# Patient Record
Sex: Male | Born: 1960
Health system: Southern US, Community
[De-identification: ages and names within clinical notes are randomized; demographics above are authoritative.]

## PROBLEM LIST (undated history)

## (undated) DIAGNOSIS — T887XXA Unspecified adverse effect of drug or medicament, initial encounter: Secondary | ICD-10-CM

## (undated) DIAGNOSIS — L723 Sebaceous cyst: Secondary | ICD-10-CM

## (undated) DIAGNOSIS — F200 Paranoid schizophrenia: Secondary | ICD-10-CM

## (undated) DIAGNOSIS — J301 Allergic rhinitis due to pollen: Secondary | ICD-10-CM

## (undated) DIAGNOSIS — T7840XA Allergy, unspecified, initial encounter: Secondary | ICD-10-CM

## (undated) HISTORY — DX: Sebaceous cyst: L72.3

## (undated) HISTORY — DX: Paranoid schizophrenia: F20.0

## (undated) HISTORY — PX: ANKLE SURGERY: SHX546

## (undated) HISTORY — DX: Allergic rhinitis due to pollen: J30.1

## (undated) HISTORY — DX: Unspecified adverse effect of drug or medicament, initial encounter: T88.7XXA

## (undated) HISTORY — DX: Allergy, unspecified, initial encounter: T78.40XA

## (undated) HISTORY — PX: COLONOSCOPY: SHX174

---

## 2005-02-16 ENCOUNTER — Ambulatory Visit: Payer: Self-pay | Admitting: Internal Medicine

## 2005-05-25 ENCOUNTER — Ambulatory Visit: Payer: Self-pay | Admitting: Internal Medicine

## 2005-11-20 ENCOUNTER — Ambulatory Visit: Payer: Self-pay | Admitting: Internal Medicine

## 2006-03-01 ENCOUNTER — Ambulatory Visit: Payer: Self-pay | Admitting: Internal Medicine

## 2006-06-21 ENCOUNTER — Ambulatory Visit: Payer: Self-pay | Admitting: Internal Medicine

## 2006-07-19 ENCOUNTER — Ambulatory Visit: Payer: Self-pay | Admitting: Internal Medicine

## 2006-08-29 ENCOUNTER — Ambulatory Visit: Payer: Self-pay | Admitting: Internal Medicine

## 2006-09-23 ENCOUNTER — Ambulatory Visit: Payer: Self-pay | Admitting: Psychiatry

## 2006-09-23 ENCOUNTER — Inpatient Hospital Stay (HOSPITAL_COMMUNITY): Admission: AD | Admit: 2006-09-23 | Discharge: 2006-09-26 | Payer: Self-pay | Admitting: Psychiatry

## 2006-12-13 ENCOUNTER — Ambulatory Visit: Payer: Self-pay | Admitting: Internal Medicine

## 2007-01-01 ENCOUNTER — Emergency Department (HOSPITAL_COMMUNITY): Admission: EM | Admit: 2007-01-01 | Discharge: 2007-01-01 | Payer: Self-pay | Admitting: Emergency Medicine

## 2007-02-07 ENCOUNTER — Ambulatory Visit: Payer: Self-pay | Admitting: Internal Medicine

## 2007-03-31 ENCOUNTER — Ambulatory Visit: Payer: Self-pay | Admitting: Internal Medicine

## 2007-03-31 DIAGNOSIS — T887XXA Unspecified adverse effect of drug or medicament, initial encounter: Secondary | ICD-10-CM

## 2007-03-31 DIAGNOSIS — F200 Paranoid schizophrenia: Secondary | ICD-10-CM

## 2007-03-31 HISTORY — DX: Paranoid schizophrenia: F20.0

## 2007-03-31 HISTORY — DX: Unspecified adverse effect of drug or medicament, initial encounter: T88.7XXA

## 2007-04-28 ENCOUNTER — Ambulatory Visit: Payer: Self-pay | Admitting: Internal Medicine

## 2007-04-28 DIAGNOSIS — E785 Hyperlipidemia, unspecified: Secondary | ICD-10-CM | POA: Insufficient documentation

## 2007-04-28 LAB — CONVERTED CEMR LAB
LDL Cholesterol: 93 mg/dL (ref 0–99)
Total CHOL/HDL Ratio: 2.4

## 2007-06-09 ENCOUNTER — Ambulatory Visit: Payer: Self-pay | Admitting: Internal Medicine

## 2007-06-09 LAB — CONVERTED CEMR LAB
Cholesterol, target level: 200 mg/dL
LDL Goal: 160 mg/dL

## 2007-09-26 ENCOUNTER — Ambulatory Visit: Payer: Self-pay | Admitting: Internal Medicine

## 2008-01-23 ENCOUNTER — Ambulatory Visit: Payer: Self-pay | Admitting: Internal Medicine

## 2008-07-05 ENCOUNTER — Ambulatory Visit: Payer: Self-pay | Admitting: Internal Medicine

## 2008-07-05 LAB — CONVERTED CEMR LAB
AST: 20 units/L (ref 0–37)
Alkaline Phosphatase: 52 units/L (ref 39–117)
Basophils Absolute: 0.1 10*3/uL (ref 0.0–0.1)
Bilirubin, Direct: 0.1 mg/dL (ref 0.0–0.3)
Lymphocytes Relative: 24.3 % (ref 12.0–46.0)
MCHC: 33.1 g/dL (ref 30.0–36.0)
Neutrophils Relative %: 66.5 % (ref 43.0–77.0)
RBC: 4.99 M/uL (ref 4.22–5.81)
RDW: 13.1 % (ref 11.5–14.6)
Total Bilirubin: 1.2 mg/dL (ref 0.3–1.2)

## 2008-09-03 ENCOUNTER — Ambulatory Visit: Payer: Self-pay | Admitting: Internal Medicine

## 2008-11-25 ENCOUNTER — Telehealth: Payer: Self-pay | Admitting: Internal Medicine

## 2009-03-01 ENCOUNTER — Ambulatory Visit: Payer: Self-pay | Admitting: Internal Medicine

## 2009-03-01 LAB — CONVERTED CEMR LAB
ALT: 16 units/L (ref 0–53)
Alkaline Phosphatase: 58 units/L (ref 39–117)
Bilirubin, Direct: 0.1 mg/dL (ref 0.0–0.3)
Total Protein: 6.5 g/dL (ref 6.0–8.3)

## 2009-03-28 ENCOUNTER — Telehealth (INDEPENDENT_AMBULATORY_CARE_PROVIDER_SITE_OTHER): Payer: Self-pay | Admitting: *Deleted

## 2010-01-24 ENCOUNTER — Ambulatory Visit: Payer: Self-pay | Admitting: Family Medicine

## 2010-01-24 DIAGNOSIS — L723 Sebaceous cyst: Secondary | ICD-10-CM | POA: Insufficient documentation

## 2010-01-24 HISTORY — DX: Sebaceous cyst: L72.3

## 2010-02-14 ENCOUNTER — Emergency Department (HOSPITAL_COMMUNITY): Admission: EM | Admit: 2010-02-14 | Discharge: 2010-02-14 | Payer: Self-pay | Admitting: Emergency Medicine

## 2010-02-23 ENCOUNTER — Ambulatory Visit: Payer: Self-pay | Admitting: Family Medicine

## 2010-02-24 LAB — CONVERTED CEMR LAB
AST: 25 units/L (ref 0–37)
BUN: 11 mg/dL (ref 6–23)
Basophils Absolute: 0.1 10*3/uL (ref 0.0–0.1)
Calcium: 9.3 mg/dL (ref 8.4–10.5)
Eosinophils Absolute: 0.1 10*3/uL (ref 0.0–0.7)
GFR calc non Af Amer: 86.35 mL/min (ref >60)
Glucose, Bld: 86 mg/dL (ref 70–99)
HCT: 41.3 % (ref 39.0–52.0)
HDL: 80.6 mg/dL (ref >39.00)
Lymphocytes Relative: 17.8 % (ref 12.0–46.0)
Lymphs Abs: 1.5 10*3/uL (ref 0.7–4.0)
MCHC: 33.3 g/dL (ref 30.0–36.0)
Monocytes Relative: 5.4 % (ref 3.0–12.0)
PSA: 1.41 ng/mL (ref 0.10–4.00)
Platelets: 240 10*3/uL (ref 150.0–400.0)
RDW: 14.8 % — ABNORMAL HIGH (ref 11.5–14.6)
Total Bilirubin: 1.3 mg/dL — ABNORMAL HIGH (ref 0.3–1.2)
Triglycerides: 41 mg/dL (ref 0.0–149.0)
VLDL: 8.2 mg/dL (ref 0.0–40.0)

## 2010-03-22 ENCOUNTER — Encounter (INDEPENDENT_AMBULATORY_CARE_PROVIDER_SITE_OTHER): Payer: Self-pay | Admitting: *Deleted

## 2010-04-14 ENCOUNTER — Encounter (INDEPENDENT_AMBULATORY_CARE_PROVIDER_SITE_OTHER): Payer: Self-pay | Admitting: *Deleted

## 2010-04-19 ENCOUNTER — Ambulatory Visit: Payer: Self-pay | Admitting: Gastroenterology

## 2010-06-09 LAB — HM COLONOSCOPY

## 2010-06-12 ENCOUNTER — Ambulatory Visit: Payer: Self-pay | Admitting: Gastroenterology

## 2010-09-19 NOTE — Letter (Signed)
Summary: [facility] Instructions  [facility]  [address] [place], [place] [address]   Phone: [phone]  Fax: [phone]       [name]    [birth year]    MRN: [id number]        Procedure Day Dorna Bloom:  Wednesday  05-03-10     Arrival Time:  9:30 a.m.     Procedure Time:  10:30 a.m.     Location of Procedure:                    _x_  [facility] (4th Floor)                        PREPARATION FOR COLONOSCOPY WITH MOVIPREP   Starting 5 days prior to your procedure 04-28-10 do not eat nuts, seeds, popcorn, corn, beans, peas,  salads, or any raw vegetables.  Do not take any fiber supplements (e.g. Metamucil, Citrucel, and Benefiber).  THE DAY BEFORE YOUR PROCEDURE         DATE:  05-02-10  DAY: Tuesday  1.  Drink clear liquids the entire day-NO SOLID FOOD  2.  Do not drink anything colored red or purple.  Avoid juices with pulp.  No orange juice.  3.  Drink at least 64 oz. (8 glasses) of fluid/clear liquids during the day to prevent dehydration and help the prep work efficiently.  CLEAR LIQUIDS INCLUDE: Water Jello Ice Popsicles Tea (sugar ok, no milk/cream) Powdered fruit flavored drinks Coffee (sugar ok, no milk/cream) Gatorade Juice: apple, white grape, white cranberry  Lemonade Clear bullion, consomm, broth Carbonated beverages (any kind) Strained chicken noodle soup Hard Candy                             4.  In the morning, mix first dose of MoviPrep solution:    Empty 1 Pouch A and 1 Pouch B into the disposable container    Add lukewarm drinking water to the top line of the container. Mix to dissolve    Refrigerate (mixed solution should be used within 24 hrs)  5.  Begin drinking the prep at 5:00 p.m. The MoviPrep container is divided by 4 marks.   Every 15 minutes drink the solution down to the next mark (approximately 8 oz) until the full liter is complete.   6.  Follow completed prep with 16 oz of clear liquid of your  choice (Nothing red or purple).  Continue to drink clear liquids until bedtime.  7.  Before going to bed, mix second dose of MoviPrep solution:    Empty 1 Pouch A and 1 Pouch B into the disposable container    Add lukewarm drinking water to the top line of the container. Mix to dissolve    Refrigerate  THE DAY OF YOUR PROCEDURE      DATE: 05-03-10  DAY: Wednesday  Beginning at  5:30 a.m. (5 hours before procedure):         1. Every 15 minutes, drink the solution down to the next mark (approx 8 oz) until the full liter is complete.  2. Follow completed prep with 16 oz. of clear liquid of your choice.    3. You may drink clear liquids until  8:30 a.m. (2 HOURS BEFORE PROCEDURE).   MEDICATION INSTRUCTIONS  Unless otherwise instructed, you should take regular prescription medications with a small sip of water  as early as possible the morning of your procedure.        OTHER INSTRUCTIONS  You will need a responsible adult at least 49 years of age to accompany you and drive you home.   This person must remain in the waiting room during your procedure.  Wear loose fitting clothing that is easily removed.  Leave jewelry and other valuables at home.  However, you may wish to bring a book to read or  an iPod/MP3 player to listen to music as you wait for your procedure to start.  Remove all body piercing jewelry and leave at home.  Total time from sign-in until discharge is approximately 2-3 hours.  You should go home directly after your procedure and rest.  You can resume normal activities the  day after your procedure.  The day of your procedure you should not:   Drive   Make legal decisions   Operate machinery   Drink alcohol   Return to work  You will receive specific instructions about eating, activities and medications before you leave.    The above instructions have been reviewed and explained to me by   Wyona Almas RN  April 19, 2010 2:37 PM     I  fully understand and can verbalize these instructions _____________________________ Date _________

## 2010-09-19 NOTE — Miscellaneous (Signed)
Summary: [facility] Previsit/prep  Clinical Lists Changes  Medications: Added new medication of MOVIPREP 100 GM  SOLR (PEG-KCL-NACL-NASULF-NA ASC-C) As per prep instructions. - Signed Rx of MOVIPREP 100 GM  SOLR (PEG-KCL-NACL-NASULF-NA ASC-C) As per prep instructions.;  #1 x 0;  Signed;  Entered by: Wyona Almas RN;  Authorized by: Mardella Layman MD [facility];  Method used: Electronically to [facility] [id number]*, [address] [place], [place]  [address], Ph: [phone], Fax: [phone] Observations: Added new observation of NKA: T (04/19/2010 13:55)    Prescriptions: MOVIPREP 100 GM  SOLR (PEG-KCL-NACL-NASULF-NA ASC-C) As per prep instructions.  #1 x 0   Entered by:   Wyona Almas RN   Authorized by:   Mardella Layman MD [facility]   Signed by:   Wyona Almas RN on 04/19/2010   Method used:   Electronically to        [facility] [phone]* (retail)       [address]       [place], [place]  [address]       Ph: [phone]       Fax: [phone]   RxID:   [id number]

## 2010-09-19 NOTE — Assessment & Plan Note (Signed)
Summary: new--will fasting--ok per dr Taevion Sikora//ccm----PT [facility] // RS   Vital Signs:  Patient profile:   50 year old male Height:      69.25 inches Weight:      159 pounds BMI:     23.40 Temp:     98.4 degrees F oral Pulse rate:   60 / minute Pulse rhythm:   regular Resp:     12 per minute BP sitting:   100 / 68  (left arm) Cuff size:   regular  Vitals Entered By: Sid Falcon LPN (February 23, 6009 9:14 AM) CC: Pt fasting for labs, CPX   History of Present Illness: Here for Medicare AWV:  Pt has Medicare and here for wellness assessment visit  1.   Risk factors based on Past M, S, F history:  FH hypertension.  Mother with gastric cancer but no hx of colon ca.  Pt has hx schizophrenia following by [facility].  Compliant with therapy there. 2.   Physical Activities: exercises regularly, no intolerance. 3.   Depression/mood: followed at [facility].  No depressive symptoms lately. 4.   Hearing: mild impairment by hx but no functional impairment.  5.   ADL's: fully independent. 6.   Fall Risk: very low risk. 7.   Home Safety: No concerns identified. 8.   Height, weight, &visual acuity:  stable.  No visual difficulty.  Weight stable.  BMI normal range. 9.   Counseling: Goes to regular counseling through mental health.  Pt needs Tdap. 10.   Labs ordered based on risk factors: CBC, hepatic, PSA, lipids, bmp  11.           Referral Coordination  Pt has no hx of colon ca screening and given race we discussed setting up referral for screening colonoscopy and he agrees. 12.           Care Plan  Needs Tdap.  Referral as above. 13.            Cognitive Assessment  No cognitive impairment.  Hx of anger control issues which he receives counseling for.   Preventive Screening-Counseling & Management      Drug Use:  no.    Allergies (verified): No Known Drug Allergies  Past History:  Past Medical History: Last updated: 03/31/2007 paranoid schezophrnia  295.30  Past Surgical History: Last updated: 06/09/2007 Denies surgical history fx ankle  Family History: Last updated: 06/09/2007 father  Unknow mother with stomach cancer Family History Hypertension  Social History: Last updated: 02/23/2010 Former Smoker 2004 Married Alcohol use-no Drug use-no No children Disabled since 2005  Risk Factors: Smoking Status: quit (03/31/2007) PMH-FH-SH reviewed for relevance  Social History: Former Smoker 2004 Married Alcohol use-no Drug use-no No children Disabled since 2005Drug Use:  no  Review of Systems  The patient denies anorexia, fever, weight loss, weight gain, vision loss, decreased hearing, hoarseness, chest pain, syncope, dyspnea on exertion, peripheral edema, prolonged cough, headaches, hemoptysis, abdominal pain, melena, hematochezia, severe indigestion/heartburn, hematuria, incontinence, genital sores, muscle weakness, suspicious skin lesions, transient blindness, difficulty walking, depression, unusual weight change, abnormal bleeding, enlarged lymph nodes, and testicular masses.    Physical Exam  General:  Well-developed,well-nourished,in no acute distress; alert,appropriate and cooperative throughout examination Head:  Normocephalic and atraumatic without obvious abnormalities. No apparent alopecia or balding. Eyes:  No corneal or conjunctival inflammation noted. EOMI. Perrla. Funduscopic exam benign, without hemorrhages, exudates or papilledema. Vision grossly normal. Ears:  External ear exam shows no significant lesions or  deformities.  Otoscopic examination reveals clear canals, tympanic membranes are intact bilaterally without bulging, retraction, inflammation or discharge. Hearing is grossly normal bilaterally. Mouth:  Oral mucosa and oropharynx without lesions or exudates.  Teeth in good repair. Neck:  No deformities, masses, or tenderness noted. Lungs:  Normal respiratory effort, chest expands symmetrically. Lungs  are clear to auscultation, no crackles or wheezes. Heart:  Normal rate and regular rhythm. S1 and S2 normal without gallop, murmur, click, rub or other extra sounds. Abdomen:  Bowel sounds positive,abdomen soft and non-tender without masses, organomegaly or hernias noted. Rectal:  No external abnormalities noted. Normal sphincter tone. No rectal masses or tenderness. Prostate:  Prostate gland firm and smooth, no enlargement, nodularity, tenderness, mass, asymmetry or induration. Extremities:  No clubbing, cyanosis, edema, or deformity noted with normal full range of motion of all joints.   Neurologic:  No cranial nerve deficits noted. Station and gait are normal. Plantar reflexes are down-going bilaterally. DTRs are symmetrical throughout. Sensory, motor and coordinative functions appear intact. Skin:  no rashes and no suspicious lesions.   Cervical Nodes:  No lymphadenopathy noted Psych:  good eye contact, not depressed appearing, and slightly anxious.     Impression & Recommendations:  Problem # 1:  Preventive Health Care (ICD-V70.0) Tdap.  Set up referral  for colonoscopy.  Problem # 2:  HYPERLIPIDEMIA NEC/NOS (ICD-272.4)  Orders: Venipuncture ([address]) TLB-Lipid Panel (80061-LIPID)  Problem # 3:  SPECIAL SCREENING MALIGNANT NEOPLASM OF PROSTATE (ICD-V76.44)  Orders: TLB-PSA (Prostate Specific Antigen) (84153-PSA)  Complete Medication List: 1)  Geodon 80 Mg Caps (Ziprasidone hcl) .... One by mouth bid 2)  Abilify 15 Mg Tabs (Aripiprazole) .... One by mouth daily  Other Orders: Tdap => 71yrs IM ([address]) Admin 1st Vaccine ([address]11) TLB-BMP (Basic Metabolic Panel-BMET) (80048-METABOL) TLB-CBC Platelet - w/Differential (85025-CBCD) TLB-Hepatic/Liver Function Pnl (80076-HEPATIC) First annual wellness visit with prevention plan  (B1478) Gastroenterology Referral (GI)    Immunizations Administered:  Tetanus Vaccine:    Vaccine Type: Tdap    Site: left deltoid    Mfr:  GlaxoSmithKline    Dose: 0.5 ml    Route: IM    Given by: Sid Falcon LPN    Exp. Date: 11/11/2011    Lot #: [id number]

## 2010-09-19 NOTE — Assessment & Plan Note (Signed)
Summary: consult re: lump on back/cjr   Vital Signs:  Patient profile:   50 year old male Temp:     98.0 degrees F oral BP sitting:   110 / 70  (left arm) Cuff size:   regular  Vitals Entered By: Sid Falcon LPN (January 24, 1609 10:55 AM) CC: Consult lump on back   History of Present Illness: Cyst on back noted about 6 months ago. Nonpainful and no drainage.  No erythema.  No recent change in size.  No other complaints.  Allergies (verified): No Known Drug Allergies  Past History:  Past Medical History: Last updated: 03/31/2007 paranoid schezophrnia 295.30 PMH reviewed for relevance  Review of Systems  The patient denies anorexia, fever, and weight loss.    Physical Exam  General:  Well-developed,well-nourished,in no acute distress; alert,appropriate and cooperative throughout examination Skin:  on back mid thoracic area, approx 1 cm inclusion cyst with no erythema, warmth, or tenderness.   Impression & Recommendations:  Problem # 1:  SEBACEOUS CYST (ICD-706.2) not infected.  Explained this could be removed and he elects to wait at this time.  Complete Medication List: 1)  Geodon 80 Mg Caps (Ziprasidone hcl) .... One by mouth bid 2)  Abilify 15 Mg Tabs (Aripiprazole) .... One by mouth daily  Patient Instructions: 1)  You have a sebaceous cyst or inclusion cyst and this is benign.  This can be removed if you desire. 2)  Schedule CPE/wellness assessment with Dr Lovell Sheehan per pt request.

## 2010-09-19 NOTE — Letter (Signed)
Summary: Previsit letter  [facility]  [address] [place], [place] [address]   Phone: [phone]  Fax: [phone]       03/22/2010 MRN: [id number]  [name] [address] [place], [place]  [address]  Dear [name],  Welcome to the Gastroenterology Division at [facility].    You are scheduled to see a nurse for your pre-procedure visit on 04-19-10 at 2:00p.m. on the 3rd floor at [facility], [address]  We ask that you try to arrive at our office 15 minutes prior to your appointment time to allow for check-in.  Your nurse visit will consist of discussing your medical and surgical history, your immediate family medical history, and your medications.    Please bring a complete list of all your medications or, if you prefer, bring the medication bottles and we will list them.  We will need to be aware of both prescribed and over the counter drugs.  We will need to know exact dosage information as well.  If you are on blood thinners (Coumadin, Plavix, Aggrenox, Ticlid, etc.) please call our office today/prior to your appointment, as we need to consult with your physician about holding your medication.   Please be prepared to read and sign documents such as consent forms, a financial agreement, and acknowledgement forms.  If necessary, and with your consent, a friend or relative is welcome to sit-in on the nurse visit with you.  Please bring your insurance card so that we may make a copy of it.  If your insurance requires a referral to see a specialist, please bring your referral form from your primary care physician.  No co-pay is required for this nurse visit.     If you cannot keep your appointment, please call [phone] to cancel or reschedule prior to your appointment date.  This allows [place] the opportunity to schedule an appointment for another patient in need of care.    Thank you for choosing [facility] for your medical  needs.  We appreciate the opportunity to care for you.  Please visit [place] at our website  to learn more about our practice.                     Sincerely.                                                                                                                   The Gastroenterology Division

## 2010-09-19 NOTE — Procedures (Signed)
Summary: Colonoscopy  Patient: [name] Note: All result statuses are Final unless otherwise noted.  Tests: (1) Colonoscopy (COL)   COL Colonoscopy           DONE     [facility]     [address]     [place], [place]  [address]           COLONOSCOPY PROCEDURE REPORT           PATIENT:  [name]  MR#:  [id number]     BIRTHDATE:  1961/08/18, 49 yrs. old  GENDER:  male     ENDOSCOPIST:  Vania Rea. Jarold Motto, MD, [facility]     REF. BY:  Evelena Peat, M.D.     PROCEDURE DATE:  06/12/2010     PROCEDURE:  Higher-risk screening colonoscopy G0105           ASA CLASS:  Class II     INDICATIONS:  family history of colon cancer     MEDICATIONS:   Fentanyl 50 mcg IV, Versed 5 mg IV           DESCRIPTION OF PROCEDURE:   After the risks benefits and     alternatives of the procedure were thoroughly explained, informed     consent was obtained.  Digital rectal exam was performed and     revealed no abnormalities.   The [id number] endoscope was     introduced through the anus and advanced to the cecum, which was     identified by both the appendix and ileocecal valve, without     limitations.  The quality of the prep was excellent, using     MoviPrep.  The instrument was then slowly withdrawn as the colon     was fully examined.     <<PROCEDUREIMAGES>>           FINDINGS:  No polyps or cancers were seen.  This was otherwise a     normal examination of the colon.   Retroflexed views in the rectum     revealed hypertrophied anal papillae.    The scope was then     withdrawn from the patient and the procedure completed.           COMPLICATIONS:  None     ENDOSCOPIC IMPRESSION:     1) No polyps or cancers     2) Otherwise normal examination     3) Hypertrophied anal papillae     RECOMMENDATIONS:     1) Given your significant family history of colon cancer, you     should have a repeat colonoscopy in 5 years     REPEAT EXAM:  No        ______________________________     Vania Rea. Jarold Motto, MD, [facility]           CC:           n.     eSIGNED:   Vania Rea. Patterson at 06/12/2010 12:00 PM           [name], [id number]  Note: An exclamation mark (!) indicates a result that was not dispersed into the flowsheet. Document Creation Date: 06/12/2010 12:03 PM _______________________________________________________________________  (1) Order result status: Final Collection or observation date-time: 06/12/2010 11:55 Requested date-time:  Receipt date-time:  Reported date-time:  Referring Physician:   Ordering Physician: Sheryn Bison [phone]) Specimen Source:  Source: Launa Grill Order Number: [id number] Lab site:   Appended Document: Colonoscopy    Clinical  Lists Changes  Observations: Added new observation of COLONNXTDUE: 05/2015 (06/12/2010 13:49)

## 2011-01-05 NOTE — H&P (Signed)
[name], [name]             ACCOUNT NO.:  [id number]   MEDICAL RECORD NO.:  [id number]          PATIENT TYPE:  IPS   LOCATION:  0301                          FACILITY:  [facility]   PHYSICIAN:  Anselm Jungling, MD  DATE OF BIRTH:  [birth year]   DATE OF ADMISSION:  09/23/2006  DATE OF DISCHARGE:                       PSYCHIATRIC ADMISSION ASSESSMENT   HISTORY:  A 50 year old single African-American male involuntary  committed on 09/23/2006.  The patient presents with a history of  paranoia and suicidal thoughts with plans to overdose.  The patient  reports becoming more depressed.  Thought people could read his mind.  He reports feeling very lonely.  He states he is here on petition.  He  denies any alcohol or drug use and has been off medications for at least  2 weeks.   PAST PSYCHIATRIC HISTORY:  First admission to the [facility] as an outpatient at [facility].  He has no  history of any suicide attempts.  He was hospitalized in [place] in  2005.   SOCIAL HISTORY:  He is a 50 year old single African-American male with  no children.  He lives with his fiance in [place].  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  He is on disability  for psychiatric illness.   FAMILY HISTORY:  Mother with schizophrenia.   ALCOHOL AND DRUG HISTORY:  Nonsmoker.  Denies any alcohol or drug use.  Primary care Zonya Gudger is Dr. Lovell Sheehan.   MEDICAL PROBLEMS:  Denies any acute or chronic health issues.   MEDICATIONS:  He has been on trazodone 100 mg at bedtime and Risperdal 3  mg b.i.d.  Noncompliant for approximately 2 weeks.   DRUG ALLERGIES:  No known allergies.   PHYSICAL EXAM:  GENERAL:  The patient is a tall, well-nourished male in  no acute distress.  VITAL SIGNS:  Temperature is 98, 67 heart rate, 20 respirations, blood  pressure 120/74, 6 feet tall, 157 pounds.   LABORATORY DATA:  CBC is within normal limits single.  C-MET  within  normal limits.  TSH is 2.613.   MENTAL STATUS EXAM:  He is fully alert, cooperative, casually dressed.  Speech is clear, articulate, normal pace and tone.  The patient feels  anxious.  The patient is calm, polite, thankful for the interview.  Thought process:  Patient is endorsing paranoid ideation.  Does not  appear very guarded.  Does not appear to be internally stimulated.  Cognitive function intact.  His memory is good.  Judgment is fair.  Insight is fair.  Concentration intact.  Average intelligence.   DIAGNOSIS:  AXIS I:  Schizophrenia, chronic, acute exacerbation.  AXIS II:  Deferred.  AXIS III:  No acute or chronic health issues.  AXIS IV:  Problems with education and other psychosocial problems  related to burden of illness.  AXIS V:  Current is 30 to 35.   PLAN:  Contract for safety.  Stabilize mood and thinking.  Will resume  his medications.  Overall reinforce medication compliance.  Case manager  is to obtain follow-up appointment.  Intended length of  stay is 3-5  days.      Landry Corporal, N.P.      Anselm Jungling, MD  Electronically Signed    JO/MEDQ  D:  09/25/2006  T:  09/26/2006  Job:  [id number]

## 2011-01-05 NOTE — Discharge Summary (Signed)
[name], [name]             ACCOUNT NO.:  [id number]   MEDICAL RECORD NO.:  [id number]          PATIENT TYPE:  IPS   LOCATION:  0301                          FACILITY:  [facility]   PHYSICIAN:  Anselm Jungling, MD  DATE OF BIRTH:  07/13/1961   DATE OF ADMISSION:  09/23/2006  DATE OF DISCHARGE:  09/26/2006                               DISCHARGE SUMMARY   IDENTIFYING DATA AND REASON FOR ADMISSION:  The patient is a 50 year old  male with a history of schizophrenia.  He presented voluntarily for  treatment with increased auditory hallucinations and paranoia.  He had  had a good response to Risperdal in the past, but had run out of  Risperdal during the month prior to admission.  He reported that he was  a client of the [facility], where he sees Dr. Allyne Gee.  He denied  alcohol and substance abuse.  Please refer to the admission note for  further details pertaining to the symptoms, circumstances, and history  that led to his hospitalization.  He was given an initial Axis I  diagnosis of schizophrenia, chronic paranoid type, with acute  exacerbation.   MEDICAL AND LABORATORY:  The patient was medically and physically  assessed by the psychiatric nurse practitioner.  He was in good health  without any active or chronic medical problems.  There were no  significant medical issues.   HOSPITAL COURSE:  The patient was admitted to the adult inpatient  psychiatric service.  He presented as a well-nourished, well-developed  male who was alert, fully oriented, pleasant, cooperative, and calm.  He  made no delusional statements, and there were no outward signs or  symptoms of psychosis or thought disorder.  He verbalized a strong  desire for help.  He agreed to restarting of his usual Risperdal, at a  dose of 3 mg b.i.d.  This was well tolerated.   He participated in various therapeutic groups and activities and was a  good participant in the treatment program.   On the fourth  hospital day, there was a family session involving the  patient and his family, more specifically, his fiancee.  The patient  started off the session by talking about things that he had worked on  while in the program.  He indicated that he wanted to find a way to  become more comfortable being around other people.  He also talked of  calling his former boss to see about trying to get his job back.  He  also talked about wanting to go back to school.  The fiance was  supportive, stating that she was proud of the patient and committed to  doing whenever it takes to help see him through his current situation.  She encouraged him to get involved more with people and activities.  They discussed the importance of follow up following discharge.  They  discussed discharge, and aftercare plan.  The patient was discharged  following this successful meeting.   AFTERCARE:  The patient was discharged with a plan to follow up at the  [facility] with an appointment on October 01, 2006.  He was also  referred to [facility] of the [place].   DISCHARGE MEDICATIONS:  Risperdal 3 mg b.i.d. and trazodone 100 mg  q.h.s..   DISCHARGE DIAGNOSES:  AXIS I:  Schizophrenia, chronic paranoid type,  acute exacerbation, resolving.  AXIS II:  Deferred.  AXIS III:  No acute or chronic illnesses.  AXIS IV:  Stressors severe.  AXIS V:  GAF on discharge 65.      Anselm Jungling, MD  Electronically Signed     SPB/MEDQ  D:  09/27/2006  T:  09/28/2006  Job:  [id number]

## 2011-01-08 ENCOUNTER — Ambulatory Visit (INDEPENDENT_AMBULATORY_CARE_PROVIDER_SITE_OTHER): Payer: Medicare Other | Admitting: Family Medicine

## 2011-01-08 ENCOUNTER — Encounter: Payer: Self-pay | Admitting: Family Medicine

## 2011-01-08 DIAGNOSIS — L603 Nail dystrophy: Secondary | ICD-10-CM

## 2011-01-08 DIAGNOSIS — L608 Other nail disorders: Secondary | ICD-10-CM

## 2011-01-08 DIAGNOSIS — H9319 Tinnitus, unspecified ear: Secondary | ICD-10-CM

## 2011-01-08 NOTE — Progress Notes (Signed)
  Subjective:    Patient ID: [name], male    DOB: June 25, 1961, 50 y.o.   MRN: [id number]  HPI Patient seen for the following  Left index finger injury several weeks ago. 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 Small subungual hematoma. Nail slightly lifted at this time. No pain. No drainage.  Bilateral ear ringing and tinnitus past few days. Use type of head phone 2 days ago and noticed ringing afterwards. No hearing loss. No vertigo.   Review of Systems  Constitutional: Negative for fever.  HENT: Positive for tinnitus. Negative for hearing loss, ear pain, congestion and ear discharge.   Neurological: Negative for headaches.       Objective:   Physical Exam  Constitutional: He appears well-developed and well-nourished.  HENT:  Head: Normocephalic and atraumatic.  Right Ear: External ear normal.  Mouth/Throat: Oropharynx is clear and moist. No oropharyngeal exudate.  Neck: Neck supple. No thyromegaly present.  Cardiovascular: Normal rate, regular rhythm and normal heart sounds.   No murmur heard. Pulmonary/Chest: Effort normal and breath sounds normal. No respiratory distress. He has no wheezes.  Musculoskeletal:       Left index finger reveals somewhat dysmorphic nail but nail is fully attached. No fluctuance. No erythema.  Lymphadenopathy:    He has no cervical adenopathy.          Assessment & Plan:  #1Dysmorphic left index fingernail secondary to trauma. No indication for nail removal this time. No signs of secondary infection. Reassurance given #2 tinnitus probably related to increased noise exposure a few days ago. Avoid loud noises. Observation

## 2011-02-27 ENCOUNTER — Encounter: Payer: Self-pay | Admitting: Family Medicine

## 2011-02-27 ENCOUNTER — Ambulatory Visit (INDEPENDENT_AMBULATORY_CARE_PROVIDER_SITE_OTHER): Payer: Medicare Other | Admitting: Family Medicine

## 2011-02-27 VITALS — BP 102/68 | HR 60 | Temp 98.0°F | Resp 12 | Ht 70.0 in | Wt 156.0 lb

## 2011-02-27 DIAGNOSIS — Z0000 Encounter for general adult medical examination without abnormal findings: Secondary | ICD-10-CM

## 2011-02-27 DIAGNOSIS — E785 Hyperlipidemia, unspecified: Secondary | ICD-10-CM

## 2011-02-27 DIAGNOSIS — Z125 Encounter for screening for malignant neoplasm of prostate: Secondary | ICD-10-CM

## 2011-02-27 LAB — BASIC METABOLIC PANEL
BUN: 10 mg/dL (ref 6–23)
CO2: 26 mEq/L (ref 19–32)
Calcium: 9.3 mg/dL (ref 8.4–10.5)
Chloride: 108 mEq/L (ref 96–112)
Creatinine, Ser: 1.2 mg/dL (ref 0.4–1.5)
Glucose, Bld: 94 mg/dL (ref 70–99)

## 2011-02-27 LAB — TSH: TSH: 1.39 u[IU]/mL (ref 0.35–5.50)

## 2011-02-27 LAB — CBC WITH DIFFERENTIAL/PLATELET
Basophils Absolute: 0 10*3/uL (ref 0.0–0.1)
Eosinophils Relative: 2.9 % (ref 0.0–5.0)
Hemoglobin: 12.7 g/dL — ABNORMAL LOW (ref 13.0–17.0)
Lymphocytes Relative: 26.6 % (ref 12.0–46.0)
Monocytes Relative: 6.6 % (ref 3.0–12.0)
Platelets: 217 10*3/uL (ref 150.0–400.0)
RDW: 14.3 % (ref 11.5–14.6)
WBC: 5.5 10*3/uL (ref 4.5–10.5)

## 2011-02-27 LAB — LIPID PANEL
LDL Cholesterol: 96 mg/dL (ref 0–99)
Total CHOL/HDL Ratio: 2

## 2011-02-27 LAB — HEPATIC FUNCTION PANEL
Alkaline Phosphatase: 64 U/L (ref 39–117)
Bilirubin, Direct: 0.2 mg/dL (ref 0.0–0.3)
Total Bilirubin: 0.9 mg/dL (ref 0.3–1.2)

## 2011-02-27 NOTE — Patient Instructions (Signed)
Continue with regular exercise. We need to consider screening colonoscopy by next year.

## 2011-02-27 NOTE — Progress Notes (Signed)
Subjective:    Patient ID: [name], male    DOB: November 23, 1960, 50 y.o.   MRN: [id number]  HPI Patient is here for wellness exam. He has history of schizophrenia which has been treated through [facility]. Takes Abilify 20 mg daily. No other medications.  1.  Risk factors based on Past Medical , Social, and Family history  past medical history as above. Quit smoking back in 2000. No alcohol use. No history of illicit drug use. Questionable family history of colon versus some type of intestinal cancer in mother and patient will confirm 2.  Limitations in physical activities  no limitation in activities 3.  Depression/mood history of schizophrenia. No reported history of depression. Mood stable. 4.  Hearing no hearing deficits. Recent tinnitus which is improving 5.  ADLs fully independent in all ADLs 6.  Cognitive function (orientation to time and place, language, writing, speech,memory) no problems with short or long-term memory. Judgment intact. 7.  Home Safety no issues identified 8.  Height, weight, and visual acuity. Height and weight are stable. No problems with visual acuity 9.  Counseling discussed exercise. Yearly flu vaccine. 10. Recommendation of preventive services.  Tetanus up-to-date. Obtain screening lab work including PSA 11. Labs based on risk factors  as above 12. Care Plan  consider screening colonoscopy.  Need to determine whether her mother had colon cancer and if so go ahead and schedule colonoscopy this time otherwise the next year  Past Medical History  Diagnosis Date  . SCHIZOPHRENIA, PARANOID, UNSPECIFIED 03/31/2007  . Sebaceous cyst 01/24/2010  . ADVEF, DRUG/MEDICINAL/BIOLOGICAL SUBST NOS 03/31/2007   Past Surgical History  Procedure Date  . Ankle surgery     fracture    reports that he quit smoking about 12 years ago. His smoking use included Cigarettes. He has a 5 pack-year smoking history. He does not have any smokeless tobacco history on  file. His alcohol and drug histories not on file. family history includes Cancer in his mother. No Known Allergies     Review of Systems  Constitutional: Negative for fever, activity change, appetite change and fatigue.  HENT: Negative for ear pain, congestion and trouble swallowing.   Eyes: Negative for pain and visual disturbance.  Respiratory: Negative for cough, shortness of breath and wheezing.   Cardiovascular: Negative for chest pain and palpitations.  Gastrointestinal: Negative for nausea, vomiting, abdominal pain, diarrhea, constipation, blood in stool, abdominal distention and rectal pain.  Genitourinary: Negative for dysuria, hematuria and testicular pain.  Musculoskeletal: Negative for joint swelling and arthralgias.  Skin: Negative for rash.  Neurological: Negative for dizziness, syncope and headaches.  Hematological: Negative for adenopathy.  Psychiatric/Behavioral: Negative for confusion and dysphoric mood.       Objective:   Physical Exam  Constitutional: He is oriented to person, place, and time. He appears well-developed and well-nourished. No distress.  HENT:  Head: Normocephalic and atraumatic.  Right Ear: External ear normal.  Left Ear: External ear normal.  Mouth/Throat: Oropharynx is clear and moist.  Eyes: Conjunctivae and EOM are normal. Pupils are equal, round, and reactive to light.  Neck: Normal range of motion. Neck supple. No thyromegaly present.  Cardiovascular: Normal rate, regular rhythm and normal heart sounds.   No murmur heard. Pulmonary/Chest: No respiratory distress. He has no wheezes. He has no rales.  Abdominal: Soft. Bowel sounds are normal. He exhibits no distension and no mass. There is no tenderness. There is no rebound and no guarding.  Genitourinary: Rectum normal and  prostate normal.  Musculoskeletal: He exhibits no edema.  Lymphadenopathy:    He has no cervical adenopathy.  Neurological: He is alert and oriented to person,  place, and time. He displays normal reflexes. No cranial nerve deficit.  Skin: No rash noted.  Psychiatric: He has a normal mood and affect.          Assessment & Plan:  Healthy 50 year old male. He has schizophrenia which has been treated through psychiatry and stable. Obtain screening lab work with PSA. Colonoscopy by next year and sooner if confirms mother had colon cancer

## 2011-02-28 NOTE — Progress Notes (Signed)
Quick Note:  Pt informed, hemocult card ready for pick-up, pt aware ______

## 2011-03-06 ENCOUNTER — Other Ambulatory Visit: Payer: Medicare Other

## 2011-03-13 ENCOUNTER — Other Ambulatory Visit: Payer: Medicare Other

## 2011-03-13 DIAGNOSIS — IMO0001 Reserved for inherently not codable concepts without codable children: Secondary | ICD-10-CM

## 2011-03-13 DIAGNOSIS — K921 Melena: Secondary | ICD-10-CM

## 2011-03-13 LAB — HEMOCCULT GUIAC POC 1CARD (OFFICE)
Card #2 Fecal Occult Blod, POC: NEGATIVE
Card #3 Fecal Occult Blood, POC: NEGATIVE

## 2011-03-14 NOTE — Progress Notes (Signed)
Quick Note:  Pt informed ______ 

## 2011-10-29 ENCOUNTER — Ambulatory Visit (INDEPENDENT_AMBULATORY_CARE_PROVIDER_SITE_OTHER): Payer: Medicare Other | Admitting: Family Medicine

## 2011-10-29 ENCOUNTER — Encounter: Payer: Self-pay | Admitting: Family Medicine

## 2011-10-29 VITALS — BP 112/82 | Temp 98.4°F | Wt 165.0 lb

## 2011-10-29 DIAGNOSIS — M25569 Pain in unspecified knee: Secondary | ICD-10-CM

## 2011-10-29 MED ORDER — MELOXICAM 15 MG PO TABS: 15.0000 mg | ORAL_TABLET | Freq: Every day | ORAL | Status: DC

## 2011-10-29 NOTE — Patient Instructions (Signed)
Knee, Cartilage (Meniscus) Injury  It is suspected that you have a torn cartilage (meniscus) in your knee. The menisci are made of tough cartilage, and fit between the surfaces of the thigh and leg bones. The menisci are "C"shaped and have a wedged profile. The wedged profile helps the stability of the joint by keeping the rounded femur surface from sliding off the flat tibial surface. The menisci are fed (nourished) by small blood vessels; but there is also a large area at the inner edge of the meniscus that does not have a good blood supply (avascular). This presents a problem when there is an injury to the meniscus, because areas without good blood supply heal poorly. As a result when there is a torn cartilage in the knee, surgery is often required to fix it. This is usually done with a surgical procedure less invasive than open surgery (arthroscopy). Some times open surgery of the knee is required if there is other damage.  PURPOSE OF THE MENISCUS  The medial meniscus rests on the medial tibial plateau. The tibia is the large bone in your lower leg (the shin bone). The medial tibial plateau is the upper end of the bone making up the inner part of your knee. The lateral meniscus serves the same purpose and is located on the outside of the knee. The menisci help to distribute your body weight across the knee joint; they act as shock absorbers. Without the meniscus present, the weight of your body would be unevenly applied to the bones in your legs (the femur and tibia). The femur is the large bone in your thigh. This uneven weight distribution would cause increased wear and tear on the cartilage lining the joint surfaces, leading to early damage (arthritis) of these areas. The presence of the menisci cartilage is necessary for a healthy knee.  PURPOSE OF THE KNEE CARTILAGE  The knee joint is made up of three bones: the thigh bone (femur), the shin bone (tibia), and the knee cap (patella). The surfaces of these  bones at the knee joint are covered with cartilage called articular cartilage. This smooth slippery surface allows the bones to slide against each other without causing bone damage. The meniscus sits between these cartilaginous surfaces of the bones. It distributes the weight evenly in the joints and helps with the stability of the joint (keeps the joint steady).  HOME CARE INSTRUCTIONS   Use crutches and external braces as instructed.   Once home an ice pack applied to your injured knee may help with discomfort and keep the swelling down. An ice pack can be used for the first couple of days or as instructed.   Only take over-the-counter or prescription medicines for pain, discomfort, or fever as directed by your caregiver.   Call if you do not have relief of pain with medications or if there is increasing in pain.   Call if your foot becomes cold or blue.   You may resume normal diet and activities as directed.   Make sure to keep your appointment with your follow-up caregiver. This injury may require further evaluation and treatment beyond the temporary treatment given today.  Document Released: 10/27/2002 Document Revised: 07/26/2011 Document Reviewed: 02/18/2009  [employer] Patient Information 2012 [employer].

## 2011-10-29 NOTE — Progress Notes (Signed)
  Subjective:    Patient ID: [name], male    DOB: 08/20/1961, 51 y.o.   MRN: [id number]  HPI  Three-day history right knee pain. Possibly mild swelling. Pain is medial location. No radiation. Denies injury. Some pain with walking. No locking. No giving way. No redness or bruising. Denies any hip pain. He has not taken any medications. No alleviating factors. Has not tried any ice seen. Exacerbated by squatting.   Review of Systems  Constitutional: Negative for fever and chills.  Musculoskeletal: Negative for gait problem.  Skin: Negative for rash.       Objective:   Physical Exam  Constitutional: He appears well-developed and well-nourished.  Cardiovascular: Normal rate and regular rhythm.   Pulmonary/Chest: Effort normal and breath sounds normal. No respiratory distress. He has no wheezes. He has no rales.  Musculoskeletal:       Right knee reveals full range of motion. No effusion. No erythema. No warmth. He has mild medial joint line tenderness. No lateral joint line tenderness. Cruciate ligament testing is normal. Collateral ligament testing normal.          Assessment & Plan:  Right knee pain. Suspect mild medial meniscal injury. Avoid squatting. Mobic 15 mg once daily and icing after walking. Followup 3 weeks if no better

## 2012-01-31 ENCOUNTER — Emergency Department (HOSPITAL_COMMUNITY)
Admission: EM | Admit: 2012-01-31 | Discharge: 2012-02-01 | Disposition: A | Discharge status: Other Acute Inpatient Hospital | Payer: Medicare Other | Source: Home / Self Care

## 2012-01-31 ENCOUNTER — Encounter (HOSPITAL_COMMUNITY): Payer: Self-pay | Admitting: *Deleted

## 2012-01-31 DIAGNOSIS — R45850 Homicidal ideations: Secondary | ICD-10-CM | POA: Insufficient documentation

## 2012-01-31 DIAGNOSIS — F200 Paranoid schizophrenia: Secondary | ICD-10-CM | POA: Insufficient documentation

## 2012-01-31 DIAGNOSIS — F22 Delusional disorders: Secondary | ICD-10-CM

## 2012-01-31 LAB — DIFFERENTIAL
Basophils Relative: 1 % (ref 0–1)
Eosinophils Absolute: 0.1 10*3/uL (ref 0.0–0.7)
Monocytes Absolute: 0.5 10*3/uL (ref 0.1–1.0)
Neutro Abs: 4.5 10*3/uL (ref 1.7–7.7)

## 2012-01-31 LAB — COMPREHENSIVE METABOLIC PANEL
BUN: 11 mg/dL (ref 6–23)
Calcium: 9.6 mg/dL (ref 8.4–10.5)
Creatinine, Ser: 1.22 mg/dL (ref 0.50–1.35)
GFR calc Af Amer: 78 mL/min — ABNORMAL LOW (ref >90)
Glucose, Bld: 97 mg/dL (ref 70–99)
Total Protein: 7.2 g/dL (ref 6.0–8.3)

## 2012-01-31 LAB — RAPID URINE DRUG SCREEN, HOSP PERFORMED
Benzodiazepines: NOT DETECTED
Cocaine: NOT DETECTED
Opiates: NOT DETECTED
Tetrahydrocannabinol: NOT DETECTED

## 2012-01-31 LAB — CBC
HCT: 39.3 % (ref 39.0–52.0)
Hemoglobin: 14 g/dL (ref 13.0–17.0)
MCH: 25.5 pg — ABNORMAL LOW (ref 26.0–34.0)
MCHC: 35.6 g/dL (ref 30.0–36.0)

## 2012-01-31 MED ORDER — NICOTINE 21 MG/24HR TD PT24: 21.0000 mg | MEDICATED_PATCH | Freq: Every day | TRANSDERMAL | Status: DC

## 2012-01-31 MED ORDER — ONDANSETRON HCL 4 MG PO TABS: 4.0000 mg | ORAL_TABLET | Freq: Three times a day (TID) | ORAL | Status: DC | PRN

## 2012-01-31 MED ORDER — LORAZEPAM 1 MG PO TABS: 1.0000 mg | ORAL_TABLET | Freq: Three times a day (TID) | ORAL | Status: DC | PRN

## 2012-01-31 MED ORDER — ZOLPIDEM TARTRATE 5 MG PO TABS: 5.0000 mg | ORAL_TABLET | Freq: Every evening | ORAL | Status: DC | PRN

## 2012-01-31 MED ORDER — ACETAMINOPHEN 325 MG PO TABS: 650.0000 mg | ORAL_TABLET | ORAL | Status: DC | PRN

## 2012-01-31 MED ORDER — OLANZAPINE 5 MG PO TABS
15.0000 mg | ORAL_TABLET | Freq: Every day | ORAL | Status: DC
  Administered 2012-01-31: 15 mg via ORAL
  Filled 2012-01-31: qty 3

## 2012-01-31 MED ORDER — IBUPROFEN 600 MG PO TABS: 600.0000 mg | ORAL_TABLET | Freq: Three times a day (TID) | ORAL | Status: DC | PRN

## 2012-01-31 NOTE — ED Notes (Signed)
[employer] at bedside 

## 2012-01-31 NOTE — ED Notes (Signed)
Pt is IVC 
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
## 2012-01-31 NOTE — ED Notes (Signed)
This writer watched patient undress and change into blue scrubs. All clothing articles were removed. One patient belonging bag searched by security and wanded.

## 2012-01-31 NOTE — ED Notes (Signed)
One bag of belongings placed in locker

## 2012-02-01 ENCOUNTER — Inpatient Hospital Stay (HOSPITAL_COMMUNITY)
Admission: AD | Admit: 2012-02-01 | Discharge: 2012-02-06 | DRG: 885 | Disposition: A | Discharge status: Home / Self Care | Payer: Medicare Other | Source: Ambulatory Visit | Attending: Psychiatry | Admitting: Psychiatry

## 2012-02-01 ENCOUNTER — Telehealth (HOSPITAL_COMMUNITY): Payer: Self-pay | Admitting: Licensed Clinical Social Worker

## 2012-02-01 DIAGNOSIS — F200 Paranoid schizophrenia: Principal | ICD-10-CM | POA: Diagnosis present

## 2012-02-01 DIAGNOSIS — E785 Hyperlipidemia, unspecified: Secondary | ICD-10-CM | POA: Diagnosis present

## 2012-02-01 MED ORDER — MAGNESIUM HYDROXIDE 400 MG/5ML PO SUSP: 30.0000 mL | Freq: Every day | ORAL | Status: DC | PRN

## 2012-02-01 MED ORDER — OLANZAPINE 5 MG PO TABS: 20.0000 mg | ORAL_TABLET | Freq: Every day | ORAL | Status: DC

## 2012-02-01 MED ORDER — NICOTINE 21 MG/24HR TD PT24
21.0000 mg | MEDICATED_PATCH | Freq: Every day | TRANSDERMAL | Status: DC
  Filled 2012-02-01 (×8): qty 1

## 2012-02-01 MED ORDER — ACETAMINOPHEN 325 MG PO TABS
650.0000 mg | ORAL_TABLET | Freq: Four times a day (QID) | ORAL | Status: DC | PRN
  Administered 2012-02-02: 650 mg via ORAL

## 2012-02-01 MED ORDER — OLANZAPINE 5 MG PO TABS
20.0000 mg | ORAL_TABLET | Freq: Every day | ORAL | Status: DC
Start: 2012-02-01 — End: 2012-02-01

## 2012-02-01 MED ORDER — ALUM & MAG HYDROXIDE-SIMETH 200-200-20 MG/5ML PO SUSP: 30.0000 mL | ORAL | Status: DC | PRN

## 2012-02-01 MED ORDER — OLANZAPINE 7.5 MG PO TABS
15.0000 mg | ORAL_TABLET | Freq: Every day | ORAL | Status: DC
  Administered 2012-02-02 – 2012-02-05 (×4): 15 mg via ORAL
  Filled 2012-02-01: qty 28
  Filled 2012-02-01: qty 2
  Filled 2012-02-01: qty 28
  Filled 2012-02-01 (×5): qty 2

## 2012-02-01 NOTE — Progress Notes (Signed)
Patient ID: [name], male   DOB: 02-Oct-1960, 51 y.o.   MRN: [id number] Pt admitted from ED, pt on admission stated that he had been feeling homicidal towards some people in his neighborhood and also to people at [employer], pt states that he is not feeling that way now and was angry when he said that, pt denies any suicidal thoughts or depression, pt denies any medical issues and states the only medication he takes is zyprexa, pt states that he rents a room and plans to go back there upon discharge, pt cooperative on admission and able to contract for safety on the unit, pt was oriented to unit and safety maintained

## 2012-02-01 NOTE — ED Provider Notes (Addendum)
Filed Vitals:   02/01/12 0533  BP: 123/81  Pulse: 72  Temp: 98 F (36.7 C)  Resp: 18  Patient is resting comfortably. Remains stable overnight without any complaints. Awaiting formal disposition from psychiatry.  Labs reviewed.  Orders reviewed and appropriate    Dayton Bailiff, MD 02/01/12 0732  telepsych recs performed and requires inpt treatment.  Will increase zyprexa to 20 mg  Dayton Bailiff, MD 02/01/12 1120

## 2012-02-01 NOTE — Consult Note (Signed)
Reason for Consult: Irritability, agitation, and history of for depression and schizophrenia Referring Physician: Dr. Fredderick Gay [name] is an 51 y.o. male.  HPI: Patient was seen and chart reviewed. Patient reported he has been irritable, angry, feel according people who is annoying him in the grocery store and also in his apartment. Patient stated he want to get help before he blow up. Patient reportedly seeing psychiatrist services at [facility] behavioral health and receiving medication Olanzapine 15 mg daily. Patient reported he has been on disability since 2006 and had a few hospitalizations in [facility] [place] [facility] in [place], [facility] in [place] and hospital in [place] and last hospitalization was in [place]. Patient reported he was smoking marijuana and using crack cocaine in 1994, when he went to the hospital in [place] patient was treated with Haldol Risperdal and now Zyprexa. Patient bipolar/borderline personality disorder, but refused to take medication. Patient is physically healthy without chronic medical conditions and he has no known drug allergies. Patient has no suicidal ideation, intentions or plan. Patient has no homicidal, intentions, or plans. He lives in an apartment by himself. Reportedly patient was worked with the [employer] in September 1985, about 3 years and then kicked out because of smoking illegal drugs like marijuana. Patient does not believe he has a VA benefits. 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  Mental status: Patient was calm, and cooperative during this evaluation. Patient has one episode of anxiety, but able to cope up with it without difficulty. Patient has normal rate, rhythm, and volume of speech and has linear and goal-directed. Thought process. Patient denied current suicidal/homicidal ideation, intentions and plans. Patient has no evidence of for  auditory or visual hallucinations, delusions or paranoia.   Past Medical History  Diagnosis Date  . SCHIZOPHRENIA, PARANOID, UNSPECIFIED 03/31/2007  . Sebaceous cyst 01/24/2010  . ADVEF, DRUG/MEDICINAL/BIOLOGICAL SUBST NOS 03/31/2007    Past Surgical History  Procedure Date  . Ankle surgery     fracture    Family History  Problem Relation Age of Onset  . Cancer Mother     stomach    Social History:  reports that he quit smoking about 13 years ago. His smoking use included Cigarettes. He has a 5 pack-year smoking history. He does not have any smokeless tobacco history on file. He reports that he uses illicit drugs (Marijuana). He reports that he does not drink alcohol.  Allergies: No Known Allergies  Medications: I have reviewed the patient's current medications.  Results for orders placed during the hospital encounter of 01/31/12 (from the past 48 hour(s))  [name]     Status: Normal   Collection Time   01/31/12  2:24 PM      Component Value Range Comment   Alcohol, Ethyl (B) <11  0 - 11 mg/dL   CBC     Status: Abnormal   Collection Time   01/31/12  2:24 PM      Component Value Range Comment   WBC 7.0  4.0 - 10.5 K/uL    RBC 5.48  4.22 - 5.81 MIL/uL    Hemoglobin 14.0  13.0 - 17.0 g/dL    HCT 40.9  81.1 - 91.4 %    MCV 71.7 (*) 78.0 - 100.0 fL    MCH 25.5 (*) 26.0 - 34.0 pg    MCHC 35.6  30.0 - 36.0 g/dL    RDW 78.2  95.6 - 21.3 %  Platelets 218  150 - 400 K/uL   DIFFERENTIAL     Status: Normal   Collection Time   01/31/12  2:24 PM      Component Value Range Comment   Neutrophils Relative 66  43 - 77 %    Lymphocytes Relative 25  12 - 46 %    Monocytes Relative 7  3 - 12 %    Eosinophils Relative 1  0 - 5 %    Basophils Relative 1  0 - 1 %    Neutro Abs 4.5  1.7 - 7.7 K/uL    Lymphs Abs 1.8  0.7 - 4.0 K/uL    Monocytes Absolute 0.5  0.1 - 1.0 K/uL    Eosinophils Absolute 0.1  0.0 - 0.7 K/uL    Basophils Absolute 0.1  0.0 - 0.1 K/uL    Smear Review MORPHOLOGY  UNREMARKABLE     COMPREHENSIVE METABOLIC PANEL     Status: Abnormal   Collection Time   01/31/12  2:24 PM      Component Value Range Comment   Sodium 139  135 - 145 mEq/L    Potassium 4.0  3.5 - 5.1 mEq/L    Chloride 104  96 - 112 mEq/L    CO2 26  19 - 32 mEq/L    Glucose, Bld 97  70 - 99 mg/dL    BUN 11  6 - 23 mg/dL    Creatinine, Ser 1.61  0.50 - 1.35 mg/dL    Calcium 9.6  8.4 - 09.6 mg/dL    Total Protein 7.2  6.0 - 8.3 g/dL    Albumin 4.2  3.5 - 5.2 g/dL    AST 20  0 - 37 U/L    ALT 13  0 - 53 U/L    Alkaline Phosphatase 82  39 - 117 U/L    Total Bilirubin 0.6  0.3 - 1.2 mg/dL    GFR calc non Af Amer 68 (*) >90 mL/min    GFR calc Af Amer 78 (*) >90 mL/min   URINE RAPID DRUG SCREEN (HOSP PERFORMED)     Status: Normal   Collection Time   01/31/12  2:30 PM      Component Value Range Comment   Opiates NONE DETECTED  NONE DETECTED    Cocaine NONE DETECTED  NONE DETECTED    Benzodiazepines NONE DETECTED  NONE DETECTED    Amphetamines NONE DETECTED  NONE DETECTED    Tetrahydrocannabinol NONE DETECTED  NONE DETECTED    Barbiturates NONE DETECTED  NONE DETECTED     No results found.  No depression, No anxiety and No psychosis Blood pressure 123/81, pulse 72, temperature 98 F (36.7 C), temperature source Oral, resp. rate 18, weight 160 lb (72.576 kg), SpO2 99.00%.   Assessment/Plan: Schizophrenia, paranoid, chronic Compliant with medication treatment History of substance abuse  Recommended outpatient psychiatric services with increased dose of for Zyprexa 20 mg daily at bedtime and followup with the [facility] behavioral health services as scheduled. Patient does not meet criteria for acute psychiatric hospitalization. May provide  2 weeks' prescription of olanzapine 20 mg at the time of discharge. Patient is in agreement with the discharge plan.   [name],JANARDHAHA R. 02/01/2012, 11:53 AM

## 2012-02-01 NOTE — ED Notes (Addendum)
pt denies SI/HI, auditory or visual halluctinations.  pt reports that when he was at [employer] he was magnifying his feelings about other peoples attitudes and that they were treating him badly. he said things had been building up and it all his frustrations came out at [employer]. at present pt reports he has thought about his actions and denies HI or wanting to hurt anyone at [employer]. pt has hx of anxiety, was treated at [facility] around 2006. at present pt denies pain.

## 2012-02-01 NOTE — BHH Counselor (Signed)
Dr. Elsie Saas (psychiatrist) also evaluated patient and recommends discharge home. Writer contacted Dr. Brooke Dare (EDP) and made him aware of patients disposition, per recommendations of the psychiatrist. Dr. Brooke Dare sts that a telepsych was completed and recommends inpatient treatment. Dr. Henrene Hawking arrived this morning for his shift and due to telepsych not being completed by the time of his arrival staff asked Dr. Elsie Saas to evaluate patient. Therefore, their was no need for a telepsych to be completed. However, their was a telepsych completed nurse ws not aware that psychiatrist was here evaluating patients.   Patients telepsych recommends inpatient treatment.   Dr. Valora Corporal recommendations recommends discharge home.   Dr. Brooke Dare prefers to go with the recommendations of the telepsych.   Pt pending placement at this time.

## 2012-02-01 NOTE — BHH Counselor (Addendum)
Pt has been accepted at [facility] by Dr. Allena Katz RM [phone]. EDP has been notified and agrees with plan. Support paperwork has been gathered and faxed to [facility].

## 2012-02-01 NOTE — ED Notes (Signed)
Pt alert and oriented x4. Respirations even and unlabored, bilateral symmetrical rise and fall of chest. Skin warm and dry. In no acute distress. Denies needs.   

## 2012-02-01 NOTE — ED Notes (Signed)
telepysch request faxed

## 2012-02-01 NOTE — ED Provider Notes (Signed)
BP 99/64  Pulse 80  Temp 98.3 F (36.8 C) (Oral)  Resp 14  Wt 160 lb (72.576 kg)  SpO2 99%  Accepted [facility] Dr. Allena Katz.  Forbes Cellar, MD 02/01/12 2140
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
## 2012-02-01 NOTE — ED Notes (Signed)
telepsych completed 

## 2012-02-01 NOTE — ED Notes (Signed)
[name] from [facility], had the [facility] request that [facility] does not send pt to [facility] until they call and confirm it is okay.

## 2012-02-02 DIAGNOSIS — F200 Paranoid schizophrenia: Principal | ICD-10-CM

## 2012-02-02 NOTE — Progress Notes (Signed)
[facility] Adult In[facility] Family/Significant Other Suicide Prevention Education  Suicide Prevention Education:  Education Completed; [name] [phone] has been identified by the patient as the family member/significant other with whom the patient will be residing, and identified as the person(s) who will aid the patient in the event of a mental health crisis (suicidal ideations/suicide attempt).  With written consent from the patient, the family member/significant other has been provided the following suicide prevention education, prior to the and/or following the discharge of the patient.  The suicide prevention education provided includes the following:  Suicide risk factors  Suicide prevention and interventions  National Suicide Hotline telephone number  [facility] assessment telephone number  [place] Emergency Assistance 911  [facility] and/or Residential Mobile Crisis Unit telephone number  Request made of family/significant other to:  Remove weapons (e.g., guns, rifles, knives), all items previously/currently identified as safety concern.    Remove drugs/medications (over-the-counter, prescriptions, illicit drugs), all items previously/currently identified as a safety concern.  The family member/significant other verbalizes understanding of the suicide prevention education information provided.  The family member/significant other agrees to remove the items of safety concern listed above.  Landlady confirmed that the pt can come home and that she can provide transportation home. She has no safety concerns. She is concerned about the pt reacting to things with anger and having problems controlling his emotions. She would like to see him continue to work with a therapist upon D/C.  [facility] 02/02/2012, 3:57 PM

## 2012-02-02 NOTE — BHH Suicide Risk Assessment (Signed)
Suicide Risk Assessment  Admission Assessment     Demographic factors:  Assessment Details Time of Assessment: Admission Information Obtained From: Patient Current Mental Status:  Current Mental Status: Thoughts of violence towards others. Denies any si, mod is ok. Logical though process Loss Factors:   unknown Historical Factors:  Historical Factors: Family history of mental illness or substance abuse Risk Reduction Factors:  Risk Reduction Factors: Positive social support  CLINICAL FACTORS:   Personality Disorders:   Cluster B  COGNITIVE FEATURES THAT CONTRIBUTE TO RISK:  Loss of executive function    SUICIDE RISK:   Moderate:  Frequent suicidal ideation with limited intensity, and duration, some specificity in terms of plans, no associated intent, good self-control, limited dysphoria/symptomatology, some risk factors present, and identifiable protective factors, including available and accessible social support.  PLAN OF CARE:   Axis I: Mood Disorder NOS r/o intermittent explosive d/o Axis II: antisocial personality traits Axis III:  Past Medical History   Diagnosis  Date   .  SCHIZOPHRENIA, PARANOID, UNSPECIFIED  03/31/2007   .  Sebaceous cyst  01/24/2010   .  ADVEF, DRUG/MEDICINAL/BIOLOGICAL SUBST NOS  03/31/2007    Axis IV: economic problems, other psychosocial or environmental problems, problems related to social environment, problems with access to health care services and problems with primary support group  Axis V: 31-40 impairment in reality testing   Plan:   - will restart zyprexa as per pt he did well on it in past -out pt follow up after discharge   Wonda Cerise 02/02/2012, 7:55 PM
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
## 2012-02-02 NOTE — Progress Notes (Signed)
D  Pt has been pleasant and cooperative   He has been interacting with others appropriately and is compliant with treatment  Pt denies suicidal and homicidal ideation and reports feeling calmer now than prior to hospitalization   He complained of a headache after lunch and thought it may have been food he ate which was a lot of carbohydrates    A  Verbal support given  Discussed reasons for headache and asked him if he had ever had high blood sugar   Medications administered and effectiveness monitored   Q 15 min checks  R  Pt safe at present

## 2012-02-02 NOTE — Progress Notes (Signed)
[facility] Group Notes:  (Counselor/Nursing/MHT/Case Management/Adjunct)  02/02/2012 11 AM  Type of Therapy:  Aftercare Planning, Group Therapy, Dance/Movement Therapy   Participation Level:  Active  Participation Quality:  Attentive  Affect:  Appropriate  Cognitive:  Oriented  Insight:  Limited  Engagement in Group:  Limited  Engagement in Therapy:  Limited  Modes of Intervention:  Clarification, Problem-solving, Role-play, Socialization and Support  Summary of Progress/Problems: After Care: Pt. attended and participated in aftercare planning group. Pt. accepted information on suicide prevention, warning signs to look for with suicide and crisis line numbers to use. The pt. agreed to call crisis line numbers if having warning signs or having thoughts of suicide.  Counseling: pt spoke about how talking to others helps keep him happy and how sometimes others "disappoint" him. Pt was active and clear in group.    Gevena Mart

## 2012-02-02 NOTE — H&P (Signed)
  Pt was seen by me today. Will continue current meds.  The detailed H&P note will be done by mid level (NP/PA).  
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
## 2012-02-02 NOTE — Progress Notes (Signed)
Patient ID: [name], male   DOB: [birth year], 51 y.o.   MRN: [id number]  [facility] Group Notes:  (Counselor/Nursing/MHT/Case Management/Adjunct)  02/02/2012 11 AM  Type of Therapy:  Aftercare Planning, Group Therapy, Dance/Movement Therapy   Participation Level:  Active  Participation Quality:  Appropriate  Affect:  Appropriate  Cognitive:  Appropriate  Insight:  Limited  Engagement in Group:  Limited  Engagement in Therapy:  Limited  Modes of Intervention:  Clarification, Problem-solving, Role-play, Socialization and Support  Summary of Progress/Problems: After Care: Pt. attended and participated in aftercare planning group. Pt. accepted information on suicide prevention, warning signs to look for with suicide and crisis line numbers to use. The pt. agreed to call crisis line numbers if having warning signs or having thoughts of suicide. Pt. listed their current anxiety level as 2 and their depression level as 3.  Counseling:   Therapist discussed ways to cope with unpleasant thoughts or actions.  Therapist asked group to name one thing that makes you happy and what makes you sad.  Therapist asked group how do cope when frustrated.  Pt. stated that " what makes me happy is having good conversation with people and family. It frustrates me when I misunderstand people.  When I am frustrated I can redirect my thoughts and think positive".  Rhunette Croft

## 2012-02-03 NOTE — Progress Notes (Signed)
Patient ID: [name], male   DOB: June 12, 1961, 51 y.o.   MRN: [id number]  [facility] MD Progress Note                                         02/03/2012    [name] 1960-09-11    [id number] Hospital day #2  WU:[id number], paranoid type The patient was seen today and reports the following:  Sleep:good  Appetite: good  Mild>(1-10) >Severe  Hopelessness (1-10): hopefull Depression (1-10): 3 Anxiety (1-10) 0 Suicidal Ideation: . The patient denies suicidal ideation Plan: None Intent: None Means:  None Homicidal Ideation: The patient denies homicidal ideation Plan: None Intent: None Means: None  Eye Contact: Good.  General Appearance/Behavior: pleasant and cooperative Motor Behavior: normal Speech:clear goal directed  Mental Status: alert Level of Consciousness: oriented x 3   Mood: euthymic Affect: congruent  Thought Process: linear Thought Content: no auditory hallucinations Perception: intact / Judgment: intact Insight:good Cognition: at least average  Sleep: Number of Hours:   Filed Vitals:   02/03/12 0835  BP: 95/60  Pulse: 86  Temp:   Resp:        . nicotine  21 mg Transdermal Q0600  . OLANZapine  15 mg Oral QHS    No results found for this or any previous visit (from the past 48 hour(s)).  ROS:    Constitutional: WDWN AAF NAD   GI: Negative for N,V,D,C   Neuro: Negative for dizziness, blurred vision, visual changes, headaches   Resp: Negative for wheezing, SOB, cough   Cardio: Negative for CP, diaphoresis, fatigue   MSK: Negative for joint pain, swelling, DROM, or ambulatory difficulties.    [facility] MD Progress Note                                         02/03/2012    [name] 08/11/1961    [id number]/[id number] Hospital day #2  Dx: The patient was seen today and reports the following:  Sleep:  Appetite:   Mild>(1-10) >Severe  Hopelessness (1-10): Depression (1-10):  Anxiety (1-10):  Suicidal Ideation: .  Plan:  None Intent: None Means:  None Homicidal Ideation:  Plan: None Intent: None Means: None  Eye Contact: Good.  General Appearance/Behavior:  Motor Behavior:  Speech:  Mental Status:  Level of Consciousness:   Mood:  Affect:  Thought Process: Thought Content:  Perception:  Judgment: Insight: Cognition:  Sleep: Number of Hours:   Filed Vitals:   02/03/12 0835  BP: 95/60  Pulse: 86  Temp:   Resp:        . nicotine  21 mg Transdermal Q0600  . OLANZapine  15 mg Oral QHS    No results found for this or any previous visit (from the past 48 hour(s)).  No results found for this or any previous visit (from the past 48 hour(s)).  ROS:    Constitutional: WDWN AAF NAD   GI: Negative for N,V,D,C   Neuro: Negative for dizziness, blurred vision, visual changes, headaches   Resp: Negative for wheezing, SOB, cough   Cardio: Negative for CP, diaphoresis, fatigue   MSK: Negative for joint pain, swelling, DROM, or ambulatory difficulties.    Treatment Plan Summary:  1. 2. 3. Plan:  1.  2.  3. 4..  [facility] MD Progress Note                                         02/03/2012    [name] 1961/06/27    [id number]/0407-02 Hospital day #2  Dx: The patient was seen today and reports the following:  Sleep:  Appetite:   Mild>(1-10) >Severe  Hopelessness (1-10): Depression (1-10):  Anxiety (1-10):  Suicidal Ideation: .  Plan: None Intent: None Means:  None Homicidal Ideation:  Plan: None Intent: None Means: None  Eye Contact: Good.  General Appearance/Behavior:  Motor Behavior:  Speech:  Mental Status:  Level of Consciousness:   Mood:  Affect:  Thought Process: Thought Content:  Perception:  Judgment: Insight: Cognition:  Sleep: Number of Hours:   Filed Vitals:   02/03/12 0835  BP: 95/60  Pulse: 86  Temp:   Resp:        . nicotine  21 mg Transdermal Q0600  . OLANZapine  15 mg Oral QHS    No results found for this or any previous  visit (from the past 48 hour(s)).  No results found for this or any previous visit (from the past 48 hour(s)).  ROS:    Constitutional: WDWN AAF NAD   GI: Negative for N,V,D,C   Neuro: Negative for dizziness, blurred vision, visual changes, headaches   Resp: Negative for wheezing, SOB, cough   Cardio: Negative for CP, diaphoresis, fatigue   MSK: Negative for joint pain, swelling, DROM, or ambulatory difficulties.    Treatment Plan Summary:  1. 2. 3. Plan:  1.  2.  3. 4..     [facility] MD Progress Note                                         02/03/2012    [name] 1960/08/30    [id number]/0407-02 Hospital day #2  Dx: The patient was seen today and reports the following:  Sleep:  Appetite:   Mild>(1-10) >Severe  Hopelessness (1-10): Depression (1-10):  Anxiety (1-10):  Suicidal Ideation: .  Plan: None Intent: None Means:  None Homicidal Ideation:  Plan: None Intent: None Means: None  Eye Contact: Good.  General Appearance/Behavior:  Motor Behavior:  Speech:  Mental Status:  Level of Consciousness:   Mood:  Affect:  Thought Process: Thought Content:  Perception:  Judgment: Insight: Cognition:  Sleep: Number of Hours:   Filed Vitals:   02/03/12 0835  BP: 95/60  Pulse: 86  Temp:   Resp:        . nicotine  21 mg Transdermal Q0600  . OLANZapine  15 mg Oral QHS    No results found for this or any previous visit (from the past 48 hour(s)).  No results found for this or any previous visit (from the past 48 hour(s)).  ROS:    Constitutional: WDWN AAF NAD   GI: Negative for N,V,D,C   Neuro: Negative for dizziness, blurred vision, visual changes, headaches   Resp: Negative for wheezing, SOB, cough   Cardio: Negative for CP, diaphoresis, fatigue   MSK: Negative for joint pain, swelling, DROM, or ambulatory difficulties.    Treatment Plan Summary:  1. 2. 3. Plan:  1.  2.  3. 4..     [facility] MD Progress Note  02/03/2012    [name] 05-24-1961    [id number] Hospital day #2  Dx: The patient was seen today and reports the following:  Sleep:  Appetite:   Mild>(1-10) >Severe  Hopelessness (1-10): Depression (1-10):  Anxiety (1-10):  Suicidal Ideation: .  Plan: None Intent: None Means:  None Homicidal Ideation:  Plan: None Intent: None Means: None  Eye Contact: Good.  General Appearance/Behavior:  Motor Behavior:  Speech:  Mental Status:  Level of Consciousness:   Mood:  Affect:  Thought Process: Thought Content:  Perception:  Judgment: Insight: Cognition:  Sleep: Number of Hours:   Filed Vitals:   02/03/12 0835  BP: 95/60  Pulse: 86  Temp:   Resp:        . nicotine  21 mg Transdermal Q0600  . OLANZapine  15 mg Oral QHS    No results found for this or any previous visit (from the past 48 hour(s)).  No results found for this or any previous visit (from the past 48 hour(s)).  ROS:    Constitutional: WDWN AAF NAD   GI: Negative for N,V,D,C   Neuro: Negative for dizziness, blurred vision, visual changes, headaches   Resp: Negative for wheezing, SOB, cough   Cardio: Negative for CP, diaphoresis, fatigue   MSK: Negative for joint pain, swelling, DROM, or ambulatory difficulties.    Treatment Plan Summary:  1. 2. 3. Plan:  1.  2.  3. 4..     [facility] MD Progress Note                                         02/03/2012    Johnston Maddocks 1960/11/30    [id number]/[id number] Hospital day #2  Dx: The patient was seen today and reports the following:  Sleep:  Appetite:   Mild>(1-10) >Severe  Hopelessness (1-10): Depression (1-10):  Anxiety (1-10):  Suicidal Ideation: .  Plan: None Intent: None Means:  None Homicidal Ideation:  Plan: None Intent: None Means: None  Eye Contact: Good.  General Appearance/Behavior:  Motor Behavior:  Speech:  Mental Status:  Level of Consciousness:   Mood:  Affect:  Thought  Process: Thought Content:  Perception:  Judgment: Insight: Cognition:  Sleep: Number of Hours:   Filed Vitals:   02/03/12 0835  BP: 95/60  Pulse: 86  Temp:   Resp:        . nicotine  21 mg Transdermal Q0600  . OLANZapine  15 mg Oral QHS    No results found for this or any previous visit (from the past 48 hour(s)).  No results found for this or any previous visit (from the past 48 hour(s)).  ROS:    Constitutional: WDWN AAF NAD   GI: Negative for N,V,D,C   Neuro: Negative for dizziness, blurred vision, visual changes, headaches   Resp: Negative for wheezing, SOB, cough   Cardio: Negative for CP, diaphoresis, fatigue   MSK: Negative for joint pain, swelling, DROM, or ambulatory difficulties.    Treatment Plan Summary:  1. 2. 3. Plan:  1.  2.  3. 4..     [facility] MD Progress Note                                         02/03/2012    Arlington Sigmund [birth year]  [id number] Hospital day #2  Dx: The patient was seen today and reports the following:  Sleep:  Appetite:   Mild>(1-10) >Severe  Hopelessness (1-10): Depression (1-10):  Anxiety (1-10):  Suicidal Ideation: .  Plan: None Intent: None Means:  None Homicidal Ideation:  Plan: None Intent: None Means: None  Eye Contact: Good.  General Appearance/Behavior:  Motor Behavior:  Speech:  Mental Status:  Level of Consciousness:   Mood:  Affect:  Thought Process: Thought Content:  Perception:  Judgment: Insight: Cognition:  Sleep: Number of Hours:   Filed Vitals:   02/03/12 0835  BP: 95/60  Pulse: 86  Temp:   Resp:        . nicotine  21 mg Transdermal Q0600  . OLANZapine  15 mg Oral QHS    No results found for this or any previous visit (from the past 48 hour(s)).  No results found for this or any previous visit (from the past 48 hour(s)).  ROS:    Constitutional: WDWN AAF NAD   GI: Negative for N,V,D,C   Neuro: Negative for dizziness, blurred vision, visual changes,  headaches   Resp: Negative for wheezing, SOB, cough   Cardio: Negative for CP, diaphoresis, fatigue   MSK: Negative for joint pain, swelling, DROM, or ambulatory difficulties.    Treatment Plan Summary:  1. 2. 3. Plan:  1.  2.  3. 4..     [facility] MD Progress Note                                         02/03/2012    Tayveon Lombardo 24-Jun-1961    [id number]/0407-02 Hospital day #2  Dx: The patient was seen today and reports the following:  Sleep:  Appetite:   Mild>(1-10) >Severe  Hopelessness (1-10): Depression (1-10):  Anxiety (1-10):  Suicidal Ideation: .  Plan: None Intent: None Means:  None Homicidal Ideation:  Plan: None Intent: None Means: None  Eye Contact: Good.  General Appearance/Behavior:  Motor Behavior:  Speech:  Mental Status:  Level of Consciousness:   Mood:  Affect:  Thought Process: Thought Content:  Perception:  Judgment: Insight: Cognition:  Sleep: Number of Hours:   Filed Vitals:   02/03/12 0835  BP: 95/60  Pulse: 86  Temp:   Resp:        . nicotine  21 mg Transdermal Q0600  . OLANZapine  15 mg Oral QHS    No results found for this or any previous visit (from the past 48 hour(s)).  No results found for this or any previous visit (from the past 48 hour(s)).  ROS:    Constitutional: WDWN AAF NAD   GI: Negative for N,V,D,C   Neuro: Negative for dizziness, blurred vision, visual changes, headaches   Resp: Negative for wheezing, SOB, cough   Cardio: Negative for CP, diaphoresis, fatigue   MSK: Negative for joint pain, swelling, DROM, or ambulatory difficulties.    Treatment Plan Summary:  1. 2. 3. Plan:  1.  2.  3. 4..     [facility] MD Progress Note                                         02/03/2012    Ronak Duquette [birth year]    [id number]/0407-02 Hospital day #2  Dx: The patient was  seen today and reports the following:  Sleep:  Appetite:   Mild>(1-10) >Severe  Hopelessness (1-10): Depression (1-10):   Anxiety (1-10):  Suicidal Ideation: .  Plan: None Intent: None Means:  None Homicidal Ideation:  Plan: None Intent: None Means: None  Eye Contact: Good.  General Appearance/Behavior:  Motor Behavior:  Speech:  Mental Status:  Level of Consciousness:   Mood:  Affect:  Thought Process: Thought Content:  Perception:  Judgment: Insight: Cognition:  Sleep: Number of Hours:   Filed Vitals:   02/03/12 0835  BP: 95/60  Pulse: 86  Temp:   Resp:        . nicotine  21 mg Transdermal Q0600  . OLANZapine  15 mg Oral QHS    No results found for this or any previous visit (from the past 48 hour(s)).  No results found for this or any previous visit (from the past 48 hour(s)).  ROS:    Constitutional: WDWN AAF NAD   GI: Negative for N,V,D,C   Neuro: Negative for dizziness, blurred vision, visual changes, headaches   Resp: Negative for wheezing, SOB, cough   Cardio: Negative for CP, diaphoresis, fatigue   MSK: Negative for joint pain, swelling, DROM, or ambulatory difficulties.    Treatment Plan Summary:  1. 2. 3. Plan:  1.  2.  3. 4..     [facility] MD Progress Note                                         02/03/2012    Fredis Malkiewicz 03-Aug-1961    [id number]/0407-02 Hospital day #2  Dx: The patient was seen today and reports the following:  Sleep:  Appetite:   Mild>(1-10) >Severe  Hopelessness (1-10): Depression (1-10):  Anxiety (1-10):  Suicidal Ideation: .  Plan: None Intent: None Means:  None Homicidal Ideation:  Plan: None Intent: None Means: None  Eye Contact: Good.  General Appearance/Behavior:  Motor Behavior:  Speech:  Mental Status:  Level of Consciousness:   Mood:  Affect:  Thought Process: Thought Content:  Perception:  Judgment: Insight: Cognition:  Sleep: Number of Hours:   Filed Vitals:   02/03/12 0835  BP: 95/60  Pulse: 86  Temp:   Resp:        . nicotine  21 mg Transdermal Q0600  . OLANZapine  15 mg Oral  QHS    No results found for this or any previous visit (from the past 48 hour(s)).  No results found for this or any previous visit (from the past 48 hour(s)).  ROS:    Constitutional: WDWN AAF NAD   GI: Negative for N,V,D,C   Neuro: Negative for dizziness, blurred vision, visual changes, headaches   Resp: Negative for wheezing, SOB, cough   Cardio: Negative for CP, diaphoresis, fatigue   MSK: Negative for joint pain, swelling, DROM, or ambulatory difficulties.    Treatment Plan Summary:  1. 2. 3. Plan:  1.  2.  3. 4..     [facility] MD Progress Note                                         02/03/2012    Bevan Disney 1961-01-22    [id number]/[id number] Hospital day #2  Dx: The patient was seen today and reports the following:  Sleep:  Appetite:   Mild>(1-10) >Severe  Hopelessness (1-10): Depression (1-10):  Anxiety (1-10):  Suicidal Ideation: .  Plan: None Intent: None Means:  None Homicidal Ideation:  Plan: None Intent: None Means: None  Eye Contact: Good.  General Appearance/Behavior:  Motor Behavior:  Speech:  Mental Status:  Level of Consciousness:   Mood:  Affect:  Thought Process: Thought Content:  Perception:  Judgment: Insight: Cognition:  Sleep: Number of Hours:   Filed Vitals:   02/03/12 0835  BP: 95/60  Pulse: 86  Temp:   Resp:        . nicotine  21 mg Transdermal Q0600  . OLANZapine  15 mg Oral QHS    No results found for this or any previous visit (from the past 48 hour(s)).  No results found for this or any previous visit (from the past 48 hour(s)).  ROS:    Constitutional: WDWN AAF NAD   GI: Negative for N,V,D,C   Neuro: Negative for dizziness, blurred vision, visual changes, headaches   Resp: Negative for wheezing, SOB, cough   Cardio: Negative for CP, diaphoresis, fatigue   MSK: Negative for joint pain, swelling, DROM, or ambulatory difficulties.    Treatment Plan Summary:  1. 2. 3. Plan:  1.  2.  3. 4..      [facility] MD Progress Note                                         02/03/2012    Teyon Odette Dec 29, 1960    [id number]/[id number] Hospital day #2  Dx: The patient was seen today and reports the following:  Sleep:  Appetite:   Mild>(1-10) >Severe  Hopelessness (1-10): Depression (1-10):  Anxiety (1-10):  Suicidal Ideation: .  Plan: None Intent: None Means:  None Homicidal Ideation:  Plan: None Intent: None Means: None  Eye Contact: Good.  General Appearance/Behavior:  Motor Behavior:  Speech:  Mental Status:  Level of Consciousness:   Mood:  Affect:  Thought Process: Thought Content:  Perception:  Judgment: Insight: Cognition:  Sleep: Number of Hours:   Filed Vitals:   02/03/12 0835  BP: 95/60  Pulse: 86  Temp:   Resp:        . nicotine  21 mg Transdermal Q0600  . OLANZapine  15 mg Oral QHS    No results found for this or any previous visit (from the past 48 hour(s)).  No results found for this or any previous visit (from the past 48 hour(s)).  ROS:    Constitutional: WDWN AAF NAD   GI: Negative for N,V,D,C   Neuro: Negative for dizziness, blurred vision, visual changes, headaches   Resp: Negative for wheezing, SOB, cough   Cardio: Negative for CP, diaphoresis, fatigue   MSK: Negative for joint pain, swelling, DROM, or ambulatory difficulties.   Treatment Plan Summary:  1. Daily contact with patient to assess and evaluate symptoms and progress in treatment.  2. Medication management  3. The patient will deny suicidal ideations or homicidal ideations for 48 hours prior to discharge and have a depression and anxiety rating of 3 or less. The patient will also deny any auditory or visual hallucinations or delusional thinking.  4. The patient will deny any symptoms of substance withdrawal at time of discharge.   Treatment Plan: 1. Continue the Olanzepine. 2. Continue the paxil  3. Treat health problems as indicated. 4. Continue to follow.  5. Recommend  [facility] upon discharge for socialization.  Rona Ravens. Suellen Durocher PAC

## 2012-02-03 NOTE — Progress Notes (Signed)
Patient ID: Taaj Hurlbut, male   DOB: 02/27/1961, 51 y.o.   MRN: [id number]   [facility] Group Notes:  (Counselor/Nursing/MHT/Case Management/Adjunct)  02/03/2012 11 AM  Type of Therapy:  Aftercare Planning, Group Therapy, Dance/Movement Therapy   Participation Level:  Active  Participation Quality:  Appropriate  Affect:  Appropriate  Cognitive:  Appropriate  Insight:  Good  Engagement in Group:  Good  Engagement in Therapy:  Good  Modes of Intervention:  Clarification, Problem-solving, Role-play, Socialization and Support  Summary of Progress/Problems: After Care: Pt. attended and participated in aftercare planning group. Pt. verbally accepted information on suicide prevention, warning signs to look for with suicide and crisis line numbers to use. Pt. is unsure of aftercare plans and that he is worried about his social skills but he is interested in working with a Veterinary surgeon.  Counseling:  Today in group, the counselor discussed supports. Patients were invited to share their definition of support and also how support can be harmful. Group ended by patients sharing one positive support they currently have in their lives. Pt. shared that he has not had support for a long time in his life but feels that support is someone who is a good listener and will not judge him.   Cassidi Long

## 2012-02-03 NOTE — Progress Notes (Signed)
Patient ID: [name], male   DOB: [birth year], 51 y.o.   MRN: [id number] Hs been out and about, participating in the milieu, interacting with select peers, attended group this evening.  Has been quiet, doesn't initiate conversation with this Clinical research associate and gives short, brief answers to questions. Came to med window and took meds without issue, no c/o's voiced, denies thoughts of self harm  Will continue to monitor.

## 2012-02-03 NOTE — Progress Notes (Signed)
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

## 2012-02-04 DIAGNOSIS — F200 Paranoid schizophrenia: Secondary | ICD-10-CM | POA: Diagnosis present

## 2012-02-04 NOTE — Progress Notes (Signed)
Patient ID: [name], male   DOB: 09/13/1960, 51 y.o.   MRN: [id number] Has been rather quiet this evening, napping at intervals, was awakened to come to med window for his meds this evening and afterward watched some basketball on tv.  Affect seemed sad,flat, had no visitors today, and interaction with staff and peers was limited. Denies suicidal thoughts, feels meds have been helping and feeling he could continue from here as an outpt. Will continue to monitor.

## 2012-02-04 NOTE — Tx Team (Signed)
Interdisciplinary Treatment Plan Update (Adult)  Date:  02/04/2012  Time Reviewed:  10:15AM-11:15AM  Progress in Treatment: Attending groups:  Yes Participating in groups:    Yes, fully engaged Taking medication as prescribed:    Yes Tolerating medication:   Yes Family/Significant other contact made:  Yes, with girlfriend (list as "landlady" in  Paperwork) Patient understands diagnosis:   Yes Discussing patient identified problems/goals with staff:   Yes Medical problems stabilized or resolved:   Yes Denies suicidal/homicidal ideation:  Yes Issues/concerns per patient self-inventory:   NoneOther:    New problem(s) identified: No, Describe:    Reason for Continuation of Hospitalization: Medication stabilization Other; describe admits to anger issues, needs to develop coping skills  Interventions implemented related to continuation of hospitalization:  Medication monitoring and adjustment, safety checks Q15 min., suicide risk assessment, group therapy, psychoeducation, collateral contact, aftercare planning, ongoing physician assessments, medication education  Additional comments:  Not applicable  Estimated length of stay:  1-2 days  Discharge Plan:  Return to live with girlfriend, follow up at [facility]):  Not applicable  Review of initial/current patient goals per problem list:   1.  Goal(s):  Deny SI and HI for 48 hours prior to D/C.  Met:  No  Target date:  By Discharge   As evidenced by:  Denies today, needs 48 hours  2.  Goal(s):  Medication stabilization  Met:  No  Target date:  By Discharge   As evidenced by:  Patient is new to team, needs longer  3.  Goal(s):  Reduce paranoid delusions to baseline.  Met:  No  Target date:  By Discharge   As evidenced by:  States that he needs to continue working on his "negative assumptions" which, when he describes them, sound like paranoid delusions about others  4.  Goal(s):  Develop anger management skills  to use at discharge.  Met:  No  Target date:  By Discharge   As evidenced by:  To continue going to groups as long as at hospital, keep working on this  Attendees: Patient:  [name]  02/04/2012 10:15AM-11:15AM  Family:     Physician:  Dr. Harvie Heck Readling 02/04/2012 10:15AM-11:15AM  Nursing:   Roswell Miners, RN 02/04/2012 10:15AM -11:15AM   Case Manager:  Ambrose Mantle, LCSW 02/04/2012 10:15AM-11:15AM  Counselor:  Marni Griffon, LCAS 02/04/2012 10:15AM-11:15AM  Other:   Abbie Sons, RN 02/04/2012 10:15AM-11:15AM  Other:   Neill Loft, RN 02/04/2012 10:15AM-11:15AM  Other:      Other:       Scribe for Treatment Team:   Sarina Ser, 02/04/2012, 10:15AM-11:15AM

## 2012-02-04 NOTE — Progress Notes (Signed)
[facility] Group Notes:  (Counselor/Nursing/MHT/Case Management/Adjunct)  02/04/2012 12:15 PM  Type of Therapy: Group Therapy   Participation Level: Minimal   Participation Quality: Minimal  Affect: Blunted   Cognitive: oriented, alert   Insight: minimal  Engagement in Group: Limited  Modes of Intervention: Clarification, Education, Problem-solving, Socialization, Activity, Encouragement and Support   Summary of Progress/Problems: Pt participated in group by listening attentively and self disclosing.  Therapist prompted clients to identify successful methods they had used to manage anger, depression, insomnia and voices.  Pt offered coping skills to deal with communication problems.  Therapy was focused on developing positive coping skills and increasing self esteem. Therapist encouraged Pt to express feelings.  Therapist explained the affirmations exercise and Pt gave positive affirmations to another Pt.  Pt was open to feedback from others.  Therapist offered support and encouragement.  Slight progress noted.  Intervention effective.         [name] 02/04/2012, 12:15 PM

## 2012-02-04 NOTE — Discharge Planning (Signed)
Met with patient in Aftercare Planning Group.   He states that he lives with his girlfriend, and can return there at discharge.  He states she is likely to pick him up at discharge.  Patient has been seen at [facility] since November 2012.  He will return there for ongoing treatment.  When call is made to the referral coordinator to set up follow-up, Case Manager will also ask for an assessment re going to the Anger Management groups that they hold at that facility.  Patient stated he wants to work on his "negative assumptions" and Case Manager asked for an example.  When he gave this, it is actually a paranoid delusion, and CM did include it in his Treatment Team Plan update today.  Patient states he is not homicidal or suicidal today.  Per State Regulation 482.30  This chart was reviewed for medical necessity with respect to the patient's Admission/Duration of stay.   Next review due:  02/07/12   Ambrose Mantle, LCSW  02/04/2012  2:06 PM

## 2012-02-04 NOTE — Progress Notes (Signed)
02/04/2012         Time: 0930      Group Topic/Focus: The focus of this group is on discussing various styles of communication and communicating assertively using 'I' (feeling) statements.   Participation Level: Active  Participation Quality: Appropriate  Affect: Appropriate  Cognitive: Alert and Oriented   Additional Comments: Patient bright, engaged, able to identify positive ways to improve communication upon discharge. Patient reports that he has a tendency to make negative assumptions and then get very angry about them, but reports he is working on staying calm and trying to be more realistic.   [name] 02/04/2012 1:37 PM

## 2012-02-04 NOTE — Progress Notes (Signed)
Patient ID: [name], male   DOB: Jan 02, 1961, 51 y.o.   MRN: [id number] He has been up and about and to groups. Denies having thoughts of wanting to harm anyone. Has been calm and cooperative.

## 2012-02-04 NOTE — Progress Notes (Signed)
[facility] MD Progress Note  02/04/2012 2:18 PM  Diagnosis:  Axis I: Schizophrenia - Paranoid Type.   The patient was seen today and reports the following:   ADL's: Intact.  Sleep: The patient reports to sleeping well without difficulty. Appetite: The patient reports a good appetite today.   Mild>(1-10) >Severe  Hopelessness (1-10): 0  Depression (1-10): 0  Anxiety (1-10): 1-2   Suicidal Ideation: The patient denies any suicidal ideations today.  Plan: No  Intent: No  Means: No   Homicidal Ideation: The patient denies any homicidal ideations today.  Plan: No  Intent: No.  Means: No   General Appearance/Behavior: The patient was friendly and cooperative today with this provider.  Eye Contact: Good.  Speech: Appropriate in rate and volume with no pressuring noted today.  Motor Behavior: wnl.  Level of Consciousness: Alert and Oriented x 3.  Mental Status: Alert and Oriented x 3.  Mood: Essentially Euthymic.  Affect: Full.  Anxiety Level: Mild anxiety reported today.  Thought Process: wnl.  Thought Content:  The patient denies any auditory or visual hallucinations today as well as any delusional thinking. Perception: wnl.  Judgment: Fair to Good.  Insight: Fair to Good.  Cognition: Oriented to person, place and time.  Sleep:  Number of Hours: 6.75    Vital Signs:Blood pressure 97/62, pulse 80, temperature 98.1 F (36.7 C), temperature source Oral, resp. rate 16, height 5\' 10"  (1.778 m), weight 70.308 kg (155 lb).  Current Medications: Current Facility-Administered Medications  Medication Dose Route Frequency Provider Last Rate Last Dose  . acetaminophen (TYLENOL) tablet 650 mg  650 mg Oral Q6H PRN Mickeal Skinner, MD   650 mg at 02/02/12 1340  . alum & mag hydroxide-simeth (MAALOX/MYLANTA) 200-200-20 MG/5ML suspension 30 mL  30 mL Oral Q4H PRN Mickeal Skinner, MD      . magnesium hydroxide (MILK OF MAGNESIA) suspension 30 mL  30 mL Oral Daily PRN Mickeal Skinner, MD      .  nicotine (NICODERM CQ - dosed in mg/24 hours) patch 21 mg  21 mg Transdermal Q0600 Mickeal Skinner, MD      . OLANZapine (ZYPREXA) tablet 15 mg  15 mg Oral QHS Mickeal Skinner, MD   15 mg at 02/03/12 2144   Lab Results: No results found for this or any previous visit (from the past 48 hour(s)).  Review of Systems:  Neurological: The patient denies any headaches today. He denies any seizures or dizziness.  G.I.: The patient denies any constipation or G.I. Upset today.  Musculoskeletal: The patient denies any muscle or skeletal difficulties.   Time was spent today discussing with the patient his current symptoms. The patient reports to sleeping well last night without difficulty and reports a good appetite.  He denies any significant feelings of sadness, anhedonia or depressed mood and reports some mild anxiety symptoms today.  He denies any auditory or visual hallucinations or delusional thinking but states that prior to admission he was having paranoid thoughts as well as homicidal ideations toward a Conservation officer, nature at [employer].  He states this occurred after becoming non-compliant with the medications for 2 days.  Treatment Plan Summary:  1. Daily contact with patient to assess and evaluate symptoms and progress in treatment.  2. Medication management  3. The patient will deny suicidal ideations or homicidal ideations for 48 hours prior to discharge and have a depression and anxiety rating of 3 or less. The patient will also deny any auditory or visual hallucinations or delusional thinking.  4. The patient will deny any symptoms of substance withdrawal at time of discharge.   Plan:  1. Will continue the medication Zyprexa at 15 mgs po qhs for psychosis and mood stabilization. 2. Laboratory studies reviewed.  3. Will continue to monitor.   Ellianna Ruest 02/04/2012, 2:18 PM

## 2012-02-05 NOTE — Discharge Planning (Signed)
Met with patient in Aftercare Planning Group.   He continues to demonstrate good insight.  Today he talked about his "negative assumptions" again, and has been working to tell himself "They're human too, just like you" when people do something that bothers him.  He said it used to be his 10th thought, long after getting angry, and now it is his 2nd thought.    Case Manager had already made a follow-up appointment for patient, but today changed it to allow for a slightly longer hospital stay.  His appointment at [facility] will be on Monday 02/11/12 at 9:00am with Dr. Eulah Pont in the walk-in clinic.  No other case management needs today.  Ambrose Mantle, LCSW 02/05/2012, 3:15 PM

## 2012-02-05 NOTE — Progress Notes (Signed)
[facility] Group Notes:  (Counselor/Nursing/MHT/Case Management/Adjunct)  02/05/2012 12:15 PM  Type of Therapy: Group Therapy   Participation Level: Minimal   Participation Quality: Minimal  Affect: Blunted   Cognitive: oriented, alert   Insight: minimal  Engagement in Group: Limited  Modes of Intervention: Clarification, Education, Problem-solving, Socialization, Activity, Encouragement and Support   Summary of Progress/Problems: Pt actively participated in group by listening attentively and self disclosing.  Therapist acknowledged one patient's birthday today and prompted clients to identify thing in their lives they can celebrate.  Pt stated he was happy to be making some progress with reducing negative assumptions. Therapy was focused on elevating mood and affirming self-worth. Therapist encouraged Pt to express feelings    Therapist offered support and encouragement.  Slight progress noted.  Intervention effective.         Christen Butter 02/05/2012, 12:15 PM

## 2012-02-05 NOTE — Progress Notes (Signed)
Patient up and in the milieu today.  Interacting well with staff and peers, but very quiet.  Appetite good.  Denies suicidal ideation.  Pleasant and cooperative today.

## 2012-02-05 NOTE — Progress Notes (Signed)
[facility] MD Progress Note  02/05/2012 3:34 PM  Diagnosis:  Axis I: Schizophrenia - Paranoid Type.   The patient was seen today and reports the following:   ADL's: Intact.  Sleep: The patient reports to sleeping well without difficulty.  Appetite: The patient reports a good appetite today.   Mild>(1-10) >Severe  Hopelessness (1-10): 0  Depression (1-10): 0  Anxiety (1-10): 0   Suicidal Ideation: The patient adamantly denies any suicidal ideations today.  Plan: No  Intent: No  Means: No   Homicidal Ideation: The patient adamantly denies any homicidal ideations today.  Plan: No  Intent: No.  Means: No   General Appearance/Behavior: The patient remained friendly and cooperative today with this provider.  Eye Contact: Good.  Speech: Appropriate in rate and volume with no pressuring noted today.  Motor Behavior: wnl.  Level of Consciousness: Alert and Oriented x 3.  Mental Status: Alert and Oriented x 3.  Mood: Essentially Euthymic.  Affect: Full.  Anxiety Level: No anxiety reported today.  Thought Process: wnl.  Thought Content: The patient denies any auditory or visual hallucinations today as well as any delusional thinking.  Perception: wnl.  Judgment: Good.  Insight: Good.  Cognition: Oriented to person, place and time.  Sleep:  Number of Hours: 6.5    Vital Signs:Blood pressure 114/89, pulse 88, temperature 97 F (36.1 C), temperature source Oral, resp. rate 16, height 5\' 10"  (1.778 m), weight 70.308 kg (155 lb).  Current Medications: Current Facility-Administered Medications  Medication Dose Route Frequency Provider Last Rate Last Dose  . acetaminophen (TYLENOL) tablet 650 mg  650 mg Oral Q6H PRN Mickeal Skinner, MD   650 mg at 02/02/12 1340  . alum & mag hydroxide-simeth (MAALOX/MYLANTA) 200-200-20 MG/5ML suspension 30 mL  30 mL Oral Q4H PRN Mickeal Skinner, MD      . magnesium hydroxide (MILK OF MAGNESIA) suspension 30 mL  30 mL Oral Daily PRN Mickeal Skinner, MD      .  nicotine (NICODERM CQ - dosed in mg/24 hours) patch 21 mg  21 mg Transdermal Q0600 Curlene Labrum Chitara Clonch, MD      . OLANZapine (ZYPREXA) tablet 15 mg  15 mg Oral QHS Curlene Labrum Eldin Bonsell, MD   15 mg at 02/04/12 2201   Lab Results: No results found for this or any previous visit (from the past 48 hour(s)).  Review of Systems:  Neurological: The patient denies any headaches today. He denies any seizures or dizziness.  G.I.: The patient denies any constipation or G.I. Upset today.  Musculoskeletal: The patient denies any muscle or skeletal difficulties.   Time was spent today discussing with the patient his current symptoms. The patient reports that he is sleeping well without difficulty and reports a good appetite. He denies any significant feelings of sadness, anhedonia or depressed mood and denies any anxiety symptoms today. He denies any auditory or visual hallucinations or delusional thinking and no longer has any concerns that other at [facility] are attempting to harm him.  The patient adamantly denies any suicidal or homicidal ideations or other concerns today.  Time was spent discussing with the patient the possibility of discharge tomorrow and the patient agreed.       Treatment Plan Summary:  1. Daily contact with patient to assess and evaluate symptoms and progress in treatment.  2. Medication management  3. The patient will deny suicidal ideations or homicidal ideations for 48 hours prior to discharge and have a depression and anxiety rating of 3 or  less. The patient will also deny any auditory or visual hallucinations or delusional thinking.  4. The patient will deny any symptoms of substance withdrawal at time of discharge.   Plan:  1. Will continue the medication Zyprexa at 15 mgs po qhs for psychosis and mood stabilization.  2. Laboratory studies reviewed.  3. Will continue to monitor.  4. Possible discharge tomorrow.  Kanani Mowbray 02/05/2012, 3:34 PM

## 2012-02-05 NOTE — Progress Notes (Signed)
Pt was in the bed on first assessment of patients at the beginning of shift at 1900.  Pt got up for evening group and participated with activities.  Pt reports his day has been fine.  He denies SI/HI/AV.  He says his thoughts are better.  He is working on not making assumptions about people's thoughts too quickly and then getting angry.  At discharge, he plans to return home with his girlfriend.  He will follow-up with [facility].  He has been pleasant/cooperative.  He voices no needs/concerns at this time.  He took his meds without incident.  Pt encouraged to make his needs known to staff.  Safety maintained with q15 minute checks.

## 2012-02-05 NOTE — Progress Notes (Deleted)
[facility] Group Notes:  (Counselor/Nursing/MHT/Case Management/Adjunct)  02/05/2012 12:15 PM  Type of Therapy: Group Therapy   Participation Level: Minimal   Participation Quality: Minimal  Affect: Blunted, (motor agitation due to [facility])  Cognitive: oriented, alert   Insight: minimal  Engagement in Group: Limited  Modes of Intervention: Clarification, Education, Problem-solving, Socialization, Activity, Encouragement and Support   Summary of Progress/Problems: Pt actively participated in group by listening attentively and self disclosing.  Therapist acknowledged one patient's birthday today and prompted clients to identify thing in their lives they can celebrate.  Pt reported that he had not made a decision yet about his Pyee but was continuing to talk with his daughter.  He stated he was happy and was planning to continue talking with others.  He was glad to sleep an entire night for the first time in a long time. Therapy was focused on elevating mood and affirming self-worth. Therapist encouraged Pt to express feelings    Therapist offered support and encouragement.  Slight progress noted.  Intervention effective.         Christen Butter 02/05/2012, 12:15 PM

## 2012-02-06 MED ORDER — OLANZAPINE 15 MG PO TABS: 15.0000 mg | ORAL_TABLET | Freq: Every day | ORAL | Status: DC

## 2012-02-06 NOTE — Progress Notes (Signed)
02/06/2012         Time: 0930      Group Topic/Focus: The focus of this group is on enhancing the patient's understanding of leisure, barriers to leisure, and the importance of engaging in positive leisure activities upon discharge for improved total health.   Participation Level: Active  Participation Quality: Appropriate and Attentive  Affect: Appropriate  Cognitive: Alert and Oriented   Additional Comments: Patient bright, reports he is looking forward to discharge today. Patient able to identify positive leisure interests.   Breanah Faddis 02/06/2012 11:40 AM

## 2012-02-06 NOTE — Progress Notes (Signed)
[facility] Group Notes:  (Counselor/Nursing/MHT/Case Management/Adjunct)  02/06/2012 2:30 PM  Type of Therapy:  [employer] Support  Participation Level:  Minimal  Participation Quality:  Attentive and Sharing  Affect:  Blunted  Cognitive:  Alert and Oriented  Insight:  Limited  Engagement in Group:  Limited  Engagement in Therapy:  Limited  Modes of Intervention:  Clarification, Education, Socialization and Support  Summary of Progress/Problems:  Pt participated in this psycho/educational session by listening attentively. Therapist introduced the representative from the [employer].  He explained to the group that he was here to tell his story and inform them of the free services available at the [employer].  The speaker told his story and explained the progression of his Schizoaffective Disorder and Substance Abuse and the course of his recovery.  He entertained questions and handed out information cards.  Pt expressed interest in the services.  Therapist encouraged patients to take advantage of these free services that would be an excellent addition to their support system.     Marni Griffon C 02/06/2012, 2:30 PM

## 2012-02-06 NOTE — Progress Notes (Signed)
D) Patient resting quietly in room upon my approach. Patient pleasant and cooperative upon my assessment. Patient denies SI/HI, denies A/V hallucinations. Patient reports slept "well," has a "good" appetite. Patient denies depression, denies hopelessness. Patient denies SI/HI, denies A/V hallucinations at this time. A) Patient given support and encouragement. R) Patient verbalizes he is "ready to go home today." Patient plans to "not make assumptions and think positive." Patient remains safe on unit with Q15 minute checks for safety.

## 2012-02-06 NOTE — Progress Notes (Signed)
Patient discharged to home with family.  Discharge instructions, prescriptions, and sample medications given. Patient's belongings returned. Patient verbalized understanding of instructions.

## 2012-02-06 NOTE — Progress Notes (Signed)
[facility] Group Notes:  (Counselor/Nursing/MHT/Case Management/Adjunct)  02/06/2012 12:30 PM  Type of Therapy: Group Therapy   Participation Level: Minimal   Participation Quality:  Sharing, Attentive   Affect: Blunted  Cognitive:  Oriented, Alert  Insight: Fair  Engagement in Group: Limited  Modes of Intervention: Clarification, Education, Problem-solving, Socialization and Support   Summary of Progress/Problems: Pt participated in group by listening attentively and self disclosing.  Therapist prompted Pts to report on progress with goals.  Pt acknowledged good progress.  Therapist prompted Pt to express feelings openly,  identify and work on the underlying issue.  Pt explained that he planned to work on not making false assumptions after discharge. Therapist offered support and encouragement.  Minimal Progress noted.  Intervention effective.    Marni Griffon 02/06/2012  12:30 pm

## 2012-02-06 NOTE — Tx Team (Signed)
Interdisciplinary Treatment Plan Update (Adult)  Date:  02/06/2012  Time Reviewed:  10:15AM-11:15AM  Progress in Treatment: Attending groups:  Yes Participating in groups:    Yes Taking medication as prescribed:    Yes Tolerating medication:   Yes Family/Significant other contact made:  Yes Patient understands diagnosis:   Yes Discussing patient identified problems/goals with staff:   Yes Medical problems stabilized or resolved:   Yes Denies suicidal/homicidal ideation:  Yes Issues/concerns per patient self-inventory:   None Other:    New problem(s) identified:  None  Reason for Continuation of Hospitalization: None  Interventions implemented related to continuation of hospitalization:  Medication monitoring and adjustment, safety checks Q15 min., suicide risk assessment, group therapy, psychoeducation, collateral contact, aftercare planning, ongoing physician assessments, medication education - UNTIL DISCHARGE  Additional comments:  Not applicable  Estimated length of stay:  Discharge today  Discharge Plan:  Return to live with girlfriend, follow up at [facility], possibly attend [facility]  New goal(s):  Not applicable  Review of initial/current patient goals per problem list:   1.  Goal(s):  Deny SI and HI for 48 hours prior to D/C.  Met:  Yes  Target date:  By Discharge   As evidenced by:  No SI/HI for days  2.  Goal(s):  Medication stabilization  Met:  Yes  Target date:  By Discharge   As evidenced by:  Stable on current meds  3.  Goal(s):  Reduce paranoid delusions to baseline.  Met:  Yes  Target date:  By Discharge   As evidenced by:  Under control, patient has also developed skills  4.  Goal(s):  Develop anger management skills to use at discharge.  Met:  Yes  Target date:  By Discharge   As evidenced by:  In groups worked on these issues, also will go to anger management classes at [facility]  Attendees: Patient:  [name]  02/06/2012  10:15AM-11:15AM  Family:     Physician:  Dr. Harvie Heck Readling 02/06/2012 10:15AM-11:15AM  Nursing:   Manuela Schwartz, RN 02/06/2012 10:15AM -11:15AM   Case Manager:  Ambrose Mantle, LCSW 02/06/2012 10:15AM-11:15AM  Counselor:  Marni Griffon, LCAS 02/06/2012 10:15AM-11:15AM  Other:      Other:      Other:      Other:       Scribe for Treatment Team:   Sarina Ser, 02/06/2012, 10:15AM-11:15AM

## 2012-02-06 NOTE — Progress Notes (Signed)
[facility] Case Management Discharge Plan:  Will you be returning to the same living situation after discharge: Yes,  with gf At discharge, do you have transportation home?:Yes,  gf Do you have the ability to pay for your medications:Yes,  income  Interagency Information:     Release of information consent forms completed and in the chart;  Patient's signature needed at discharge.  Patient to Follow up at:  Follow-up Information    Follow up with Dr. Eulah Pont on 02/11/2012. (9:00AM appointment at the [facility])    Contact information:   [facility] [address]  [address] Telephone:  [phone]      Follow up with [facility]. (Attend classes as desired)    Contact information:   [facility] [place] [address] [place] Telephone:  [phone]         Patient denies SI/HI:   Yes,      Safety Planning and Suicide Prevention discussed:  Yes,  During Aftercare Planning Group, Case Manager provided psychoeducation on "Suicide Prevention Information."  This included descriptions of risk factors for suicide, warning signs that an individual is in crisis and thinking of suicide, and what to do if this occurs.  Pt indicated understanding of information provided, and will read brochure given upon discharge.     Barrier to discharge identified:No.  Summary and Recommendations:  Patient will go to [facility] for med mgmt and for anger management classes.  He may go to [employer] [facility] for classes as well.   Sarina Ser 02/06/2012, 6:36 PM

## 2012-02-06 NOTE — Progress Notes (Signed)
Pt reports his day has been good.  He is observed lying in his bed awake.  He reports he has been going to groups.  He denies SI/HI/AV.  He tells me this before he is even asked.  Writer smiled and said," You know the drill" and pt smiled back.  He voices no needs/concerns.  His plan is to return to live with his girlfriend.  Discharge may be tomorrow.  Safety maintained with q15 minute checks.

## 2012-02-06 NOTE — [facility] Suicide Risk Assessment (Signed)
Suicide Risk Assessment  Discharge Assessment     Demographic factors:  Male;Low socioeconomic status;Living alone  Current Mental Status Per Nursing Assessment::   On Admission:  Thoughts of violence towards others At Discharge:  The patient is alert and oriented x 3 and is friendly and cooperative with this Physician.  The patient denies any significant feelings of sadness, anhedonia or depressed mood and adamantly denies any suicidal or homicidal ideations.  He also adamantly denies any auditory or visual hallucinations or delusional thinking.  The patient states that he realizes people at [facility] can also have a "bad days" and aren't singling him out.    Current Mental Status Per Physician:  Diagnosis:  Axis I: Schizophrenia - Paranoid Type.   The patient was seen today and reports the following:   ADL's: Intact.  Sleep: The patient reports to sleeping well without difficulty.  Appetite: The patient reports a good appetite today.   Mild>(1-10) >Severe  Hopelessness (1-10): 0  Depression (1-10): 0  Anxiety (1-10): 0   Suicidal Ideation: The patient adamantly denies any suicidal ideations today.  Plan: No  Intent: No  Means: No   Homicidal Ideation: The patient adamantly denies any homicidal ideations today.  Plan: No  Intent: No.  Means: No   General Appearance/Behavior: The patient remained friendly and cooperative today with this provider.  Eye Contact: Good.  Speech: Appropriate in rate and volume with no pressuring noted today.  Motor Behavior: wnl.  Level of Consciousness: Alert and Oriented x 3.  Mental Status: Alert and Oriented x 3.  Mood: Essentially Euthymic.  Affect: Full.  Anxiety Level: No anxiety reported today.  Thought Process: wnl.  Thought Content: The patient denies any auditory or visual hallucinations today as well as any delusional thinking.  Perception: wnl.  Judgment: Good.  Insight: Good.  Cognition: Oriented to person, place and time.    Sleep: Number of Hours: 6.5    Loss Factors: No acute losses noted.  Historical Factors: Family history of mental illness or substance abuse  Risk Reduction Factors:   Living with another person, especially a relative;Positive social support;Positive therapeutic relationship;Positive coping skills or problem solving skills  Continued Clinical Symptoms:  Schizophrenia:   Paranoid or undifferentiated type Previous Psychiatric Diagnoses and Treatments  Discharge Diagnoses:   AXIS I:   Schizophrenia - Paranoid Type. AXIS II:   Deferred. AXIS III:   1. Sebaceous Cyst. AXIS IV:  Chronic Mental Illness.  Non-compliance with medications.   AXIS V:   GAF at time of admission approximately 30.  GAF at time of discharge approximately 55.  Cognitive Features That Contribute To Risk:  None Noted.   Vital Signs:Blood pressure 114/89, pulse 88, temperature 97 F (36.1 C), temperature source Oral, resp. rate 16, height 5\' 10"  (1.778 m), weight 70.308 kg (155 lb).   Current Medications:  Current Facility-Administered Medications   Medication  Dose  Route  Frequency  Provider  Last Rate  Last Dose   .  acetaminophen (TYLENOL) tablet 650 mg  650 mg  Oral  Q6H PRN  Mickeal Skinner, MD   650 mg at 02/02/12 1340   .  alum & mag hydroxide-simeth (MAALOX/MYLANTA) 200-200-20 MG/5ML suspension 30 mL  30 mL  Oral  Q4H PRN  Mickeal Skinner, MD     .  magnesium hydroxide (MILK OF MAGNESIA) suspension 30 mL  30 mL  Oral  Daily PRN  Mickeal Skinner, MD     .  nicotine (NICODERM CQ - dosed  in mg/24 hours) patch 21 mg  21 mg  Transdermal  Q0600  Curlene Labrum Anelly Samarin, MD     .  OLANZapine (ZYPREXA) tablet 15 mg  15 mg  Oral  QHS  Curlene Labrum Jontez Redfield, MD   15 mg at 02/04/12 2201    Lab Results: No results found for this or any previous visit (from the past 48 hour(s)).   Review of Systems:  Neurological: The patient denies any headaches today. He denies any seizures or dizziness.  G.I.: The patient denies any  constipation or G.I. Upset today.  Musculoskeletal: The patient denies any muscle or skeletal difficulties.   Time was spent today discussing with the patient his current symptoms. The patient reports that he is sleeping well without difficulty and reports a good appetite. He denies any significant feelings of sadness, anhedonia or depressed mood and denies any anxiety symptoms today. He denies any auditory or visual hallucinations or delusional thinking and no longer has any concerns that other at [facility] are attempting to harm him. The patient adamantly denies any suicidal or homicidal ideations or other concerns today. Time was spent discussing with the patient the possible discharge today and the patient agrees.   Treatment Plan Summary:  1. Daily contact with patient to assess and evaluate symptoms and progress in treatment.  2. Medication management  3. The patient will deny suicidal ideations or homicidal ideations for 48 hours prior to discharge and have a depression and anxiety rating of 3 or less. The patient will also deny any auditory or visual hallucinations or delusional thinking.  4. The patient will deny any symptoms of substance withdrawal at time of discharge.   Plan:  1. Will continue the medication Zyprexa at 15 mgs po qhs for psychosis and mood stabilization.  2. Laboratory studies reviewed.  3. Will continue to monitor.  4. Discharge today.  Suicide Risk:  Minimal: No identifiable suicidal ideation.  Patients presenting with no risk factors but with morbid ruminations; may be classified as minimal risk based on the severity of the depressive symptoms  Plan Of Care/Follow-up recommendations:  Activity:  As tolerated. Diet:  Regular Diet. Other:  Please take all medications only as directed and keep all scheduled follow up appointments.  Ariani Seier 02/06/2012, 2:38 PM

## 2012-02-08 NOTE — Progress Notes (Signed)
Patient Discharge Instructions: No consent for [name], 02/08/2012, 11:39 AM
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
## 2013-04-25 ENCOUNTER — Emergency Department (HOSPITAL_COMMUNITY): Payer: Medicare Other

## 2013-04-25 ENCOUNTER — Emergency Department (HOSPITAL_COMMUNITY)
Admission: EM | Admit: 2013-04-25 | Discharge: 2013-04-26 | Disposition: A | Discharge status: Home / Self Care | Payer: Medicare Other | Attending: Emergency Medicine | Admitting: Emergency Medicine

## 2013-04-25 ENCOUNTER — Encounter (HOSPITAL_COMMUNITY): Payer: Self-pay | Admitting: *Deleted

## 2013-04-25 DIAGNOSIS — S0993XA Unspecified injury of face, initial encounter: Secondary | ICD-10-CM | POA: Insufficient documentation

## 2013-04-25 DIAGNOSIS — F200 Paranoid schizophrenia: Secondary | ICD-10-CM | POA: Insufficient documentation

## 2013-04-25 DIAGNOSIS — Y9389 Activity, other specified: Secondary | ICD-10-CM | POA: Insufficient documentation

## 2013-04-25 DIAGNOSIS — Z872 Personal history of diseases of the skin and subcutaneous tissue: Secondary | ICD-10-CM | POA: Insufficient documentation

## 2013-04-25 DIAGNOSIS — Y92410 Unspecified street and highway as the place of occurrence of the external cause: Secondary | ICD-10-CM | POA: Insufficient documentation

## 2013-04-25 DIAGNOSIS — Z79899 Other long term (current) drug therapy: Secondary | ICD-10-CM | POA: Insufficient documentation

## 2013-04-25 DIAGNOSIS — Z87891 Personal history of nicotine dependence: Secondary | ICD-10-CM | POA: Insufficient documentation

## 2013-04-25 DIAGNOSIS — M62838 Other muscle spasm: Secondary | ICD-10-CM

## 2013-04-25 DIAGNOSIS — R42 Dizziness and giddiness: Secondary | ICD-10-CM | POA: Insufficient documentation

## 2013-04-25 MED ORDER — MECLIZINE HCL 25 MG PO TABS
25.0000 mg | ORAL_TABLET | Freq: Once | ORAL | Status: AC
  Administered 2013-04-25: 25 mg via ORAL
  Filled 2013-04-25: qty 1
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
## 2013-04-26 MED ORDER — METHOCARBAMOL 500 MG PO TABS: 500.0000 mg | ORAL_TABLET | Freq: Two times a day (BID) | ORAL | Status: DC | PRN

## 2013-04-26 MED ORDER — MECLIZINE HCL 25 MG PO TABS: 25.0000 mg | ORAL_TABLET | Freq: Three times a day (TID) | ORAL | Status: DC | PRN
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
## 2013-04-26 NOTE — ED Provider Notes (Signed)
Medical screening examination/treatment/procedure(s) were performed by non-physician practitioner and as supervising physician I was immediately available for consultation/collaboration.   Dione Booze, MD 04/26/13 [phone]

## 2013-07-24 ENCOUNTER — Other Ambulatory Visit (INDEPENDENT_AMBULATORY_CARE_PROVIDER_SITE_OTHER): Payer: Medicare Other

## 2013-07-24 DIAGNOSIS — Z125 Encounter for screening for malignant neoplasm of prostate: Secondary | ICD-10-CM

## 2013-07-24 DIAGNOSIS — Z0000 Encounter for general adult medical examination without abnormal findings: Secondary | ICD-10-CM

## 2013-07-24 DIAGNOSIS — E785 Hyperlipidemia, unspecified: Secondary | ICD-10-CM

## 2013-07-24 LAB — HEPATIC FUNCTION PANEL
AST: 17 U/L (ref 0–37)
Albumin: 4.1 g/dL (ref 3.5–5.2)
Total Bilirubin: 0.9 mg/dL (ref 0.3–1.2)

## 2013-07-24 LAB — BASIC METABOLIC PANEL
BUN: 10 mg/dL (ref 6–23)
CO2: 26 mEq/L (ref 19–32)
GFR: 82.68 mL/min (ref >60.00)
Glucose, Bld: 96 mg/dL (ref 70–99)
Potassium: 4.1 mEq/L (ref 3.5–5.1)
Sodium: 138 mEq/L (ref 135–145)

## 2013-07-24 LAB — POCT URINALYSIS DIPSTICK
Glucose, UA: NEGATIVE
Nitrite, UA: NEGATIVE
Protein, UA: NEGATIVE
Spec Grav, UA: 1.02
Urobilinogen, UA: 0.2

## 2013-07-24 LAB — LDL CHOLESTEROL, DIRECT: Direct LDL: 116.2 mg/dL

## 2013-07-24 LAB — LIPID PANEL
Cholesterol: 212 mg/dL — ABNORMAL HIGH (ref 0–200)
Total CHOL/HDL Ratio: 3
Triglycerides: 24 mg/dL (ref 0.0–149.0)

## 2013-07-24 LAB — PSA: PSA: 1.14 ng/mL (ref 0.10–4.00)

## 2013-07-24 LAB — CBC WITH DIFFERENTIAL/PLATELET
Basophils Absolute: 0 10*3/uL (ref 0.0–0.1)
Eosinophils Absolute: 0.1 10*3/uL (ref 0.0–0.7)
HCT: 39.9 % (ref 39.0–52.0)
Hemoglobin: 13.3 g/dL (ref 13.0–17.0)
Lymphs Abs: 1.7 10*3/uL (ref 0.7–4.0)
MCHC: 33.4 g/dL (ref 30.0–36.0)
MCV: 76.6 fl — ABNORMAL LOW (ref 78.0–100.0)
Monocytes Absolute: 0.5 10*3/uL (ref 0.1–1.0)
Neutro Abs: 3.7 10*3/uL (ref 1.4–7.7)
Platelets: 209 10*3/uL (ref 150.0–400.0)
RDW: 14.5 % (ref 11.5–14.6)

## 2013-07-24 LAB — TSH: TSH: 1.71 u[IU]/mL (ref 0.35–5.50)

## 2013-07-27 ENCOUNTER — Encounter: Payer: Self-pay | Admitting: Family Medicine

## 2013-09-02 ENCOUNTER — Encounter: Payer: Self-pay | Admitting: Family Medicine

## 2013-09-02 ENCOUNTER — Ambulatory Visit (INDEPENDENT_AMBULATORY_CARE_PROVIDER_SITE_OTHER): Payer: Medicare Other | Admitting: Family Medicine

## 2013-09-02 VITALS — BP 124/80 | HR 85 | Temp 98.3°F | Ht 70.0 in | Wt 167.0 lb

## 2013-09-02 DIAGNOSIS — Z0000 Encounter for general adult medical examination without abnormal findings: Secondary | ICD-10-CM

## 2013-09-02 DIAGNOSIS — R319 Hematuria, unspecified: Secondary | ICD-10-CM

## 2013-09-02 LAB — POCT URINALYSIS DIPSTICK
Bilirubin, UA: NEGATIVE
Glucose, UA: NEGATIVE
KETONES UA: NEGATIVE
LEUKOCYTES UA: NEGATIVE
Nitrite, UA: NEGATIVE
PH UA: 7
PROTEIN UA: NEGATIVE
RBC UA: NEGATIVE
Spec Grav, UA: 1.02
Urobilinogen, UA: 0.2

## 2013-09-02 NOTE — Progress Notes (Signed)
   Subjective:    Patient ID: [name], male    DOB: 03/13/1961, 53 y.o.   MRN: [id number]  HPI Patient seen for complete physical. He has history of paranoid schizophrenia followed by mental health. Currently stable on Risperdol 3 mg daily. Takes no other prescription medications. He declines flu vaccine. Tetanus up-to-date. Colonoscopy few years ago secondary to family history of colon problems He exercises regularly most days a week. Denies any chest pains or other concerns. He is a former smoker and quit in 2000.  Past Medical History  Diagnosis Date  . SCHIZOPHRENIA, PARANOID, UNSPECIFIED 03/31/2007  . Sebaceous cyst 01/24/2010  . ADVEF, DRUG/MEDICINAL/BIOLOGICAL SUBST NOS 03/31/2007   Past Surgical History  Procedure Laterality Date  . Ankle surgery      fracture    reports that he quit smoking about 14 years ago. His smoking use included Cigarettes. He has a 5 pack-year smoking history. He does not have any smokeless tobacco history on file. He reports that he uses illicit drugs (Marijuana). He reports that he does not drink alcohol. family history includes Cancer in his mother. No Known Allergies     Review of Systems  Constitutional: Negative for fever, activity change, appetite change and fatigue.  HENT: Negative for congestion, ear pain and trouble swallowing.   Eyes: Negative for pain and visual disturbance.  Respiratory: Negative for cough, shortness of breath and wheezing.   Cardiovascular: Negative for chest pain and palpitations.  Gastrointestinal: Negative for nausea, vomiting, abdominal pain, diarrhea, constipation, blood in stool, abdominal distention and rectal pain.  Endocrine: Negative for polydipsia and polyuria.  Genitourinary: Negative for dysuria, hematuria and testicular pain.  Musculoskeletal: Negative for arthralgias and joint swelling.  Skin: Negative for rash.  Neurological: Negative for dizziness, syncope and headaches.  Hematological:  Negative for adenopathy.  Psychiatric/Behavioral: Negative for confusion and dysphoric mood.       Objective:   Physical Exam  Constitutional: He is oriented to person, place, and time. He appears well-developed and well-nourished. No distress.  HENT:  Head: Normocephalic and atraumatic.  Right Ear: External ear normal.  Left Ear: External ear normal.  Mouth/Throat: Oropharynx is clear and moist.  Eyes: Conjunctivae and EOM are normal. Pupils are equal, round, and reactive to light.  Neck: Normal range of motion. Neck supple. No thyromegaly present.  Cardiovascular: Normal rate, regular rhythm and normal heart sounds.   No murmur heard. Pulmonary/Chest: No respiratory distress. He has no wheezes. He has no rales.  Abdominal: Soft. Bowel sounds are normal. He exhibits no distension and no mass. There is no tenderness. There is no rebound and no guarding.  Musculoskeletal: He exhibits no edema.  Lymphadenopathy:    He has no cervical adenopathy.  Neurological: He is alert and oriented to person, place, and time. He displays normal reflexes. No cranial nerve deficit.  Skin: No rash noted.  Psychiatric: He has a normal mood and affect.          Assessment & Plan:  Complete physical. Labs reviewed. He has extremely good HDL. He has trace blood on urine dipstick of uncertain significance. Repeat urine dipstick and if positive send for urine micro-. Continue regular exercise habits. Flu vaccine declined

## 2013-09-02 NOTE — Progress Notes (Signed)
Pre visit review using our clinic review tool, if applicable. No additional management support is needed unless otherwise documented below in the visit note. 

## 2013-10-01 ENCOUNTER — Telehealth: Payer: Self-pay | Admitting: Family Medicine

## 2013-10-01 NOTE — Telephone Encounter (Signed)
Left message for patient to return call.

## 2013-10-01 NOTE — Telephone Encounter (Signed)
Would consider Hemoccults yearly starting 5 years after his most recent colonoscopy up until repeat colonoscopy at year 7110

## 2013-10-01 NOTE — Telephone Encounter (Signed)
Pt was reading info about hemoccult card and would like to know how often he should have that done. Pt had colonoscopy 2 yrs ago w/ no complications. pls advise

## 2013-10-02 NOTE — Telephone Encounter (Signed)
Pt aware of hemocult message
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
## 2014-04-27 ENCOUNTER — Encounter: Payer: Self-pay | Admitting: Gastroenterology

## 2014-08-26 ENCOUNTER — Ambulatory Visit (INDEPENDENT_AMBULATORY_CARE_PROVIDER_SITE_OTHER): Payer: Medicare Other | Admitting: Family Medicine

## 2014-08-26 ENCOUNTER — Encounter: Payer: Self-pay | Admitting: Family Medicine

## 2014-08-26 VITALS — BP 120/70 | HR 74 | Temp 97.9°F | Wt 150.0 lb

## 2014-08-26 DIAGNOSIS — M79644 Pain in right finger(s): Secondary | ICD-10-CM

## 2014-08-26 DIAGNOSIS — L929 Granulomatous disorder of the skin and subcutaneous tissue, unspecified: Secondary | ICD-10-CM

## 2014-08-26 MED ORDER — CEPHALEXIN 500 MG PO CAPS: 500.0000 mg | ORAL_CAPSULE | Freq: Three times a day (TID) | ORAL | Status: DC

## 2014-08-26 NOTE — Progress Notes (Signed)
Pre visit review using our clinic review tool, if applicable. No additional management support is needed unless otherwise documented below in the visit note. 

## 2014-08-26 NOTE — Patient Instructions (Signed)
Warm salt water soaks 2-3 times daily Follow up for any increased redness, warmth, or pain.

## 2014-08-26 NOTE — Progress Notes (Signed)
   Subjective:    Patient ID: [name], male    DOB: 09/14/1960, 54 y.o.   MRN: [id number]  HPI  Patient seen with some swelling and minimally tender right middle finger. Location is near the base of the nail near the cuticle. He states back in October he notice extremely swollen and painful finger but no history of injury. He thinks is maybe drained some then. Finger is actually better at this time but not fully resolved. No history of known puncture. He did note some drainage from this a few weeks ago but none recently.  Past Medical History  Diagnosis Date  . SCHIZOPHRENIA, PARANOID, UNSPECIFIED 03/31/2007  . Sebaceous cyst 01/24/2010  . ADVEF, DRUG/MEDICINAL/BIOLOGICAL SUBST NOS 03/31/2007   Past Surgical History  Procedure Laterality Date  . Ankle surgery      fracture    reports that he quit smoking about 15 years ago. His smoking use included Cigarettes. He has a 5 pack-year smoking history. He does not have any smokeless tobacco history on file. He reports that he uses illicit drugs (Marijuana). He reports that he does not drink alcohol. family history includes Cancer in his mother. No Known Allergies   Review of Systems  Constitutional: Negative for fever and chills.       Objective:   Physical Exam  Constitutional: He appears well-developed and well-nourished.  Cardiovascular: Normal rate and regular rhythm.   Pulmonary/Chest: Effort normal and breath sounds normal. No respiratory distress. He has no wheezes. He has no rales.  Skin:  Right middle finger reveals some soft tissue swelling of the base of the nail bordering the index finger.  He appears to have small amount of granulation tissue here. There is no visible purulence and no erythema. Minimally tender. No warmth.          Assessment & Plan:  Right middle finger pain. Appears from history might have had recent paronychia. He appears to have some small granulation tissue which is accounting for some of  the swelling. No obvious fluctuance or pus pockets at this time. He does have minimal tenderness. Will treated Keflex 500 mg 3 times a day for 10 days and warm saltwater soaks and follow-up in one or 2 weeks if not further improved

## 2014-09-27 ENCOUNTER — Encounter: Payer: Self-pay | Admitting: Family Medicine

## 2014-09-27 ENCOUNTER — Ambulatory Visit (INDEPENDENT_AMBULATORY_CARE_PROVIDER_SITE_OTHER): Payer: Medicare Other | Admitting: Family Medicine

## 2014-09-27 VITALS — BP 110/70 | HR 60 | Temp 98.0°F | Wt 150.0 lb

## 2014-09-27 DIAGNOSIS — L03011 Cellulitis of right finger: Secondary | ICD-10-CM

## 2014-09-27 MED ORDER — DOXYCYCLINE HYCLATE 100 MG PO CAPS: 100.0000 mg | ORAL_CAPSULE | Freq: Two times a day (BID) | ORAL | Status: DC

## 2014-09-27 NOTE — Patient Instructions (Signed)
Warm salt water soaks at least twice daily. 

## 2014-09-27 NOTE — Progress Notes (Signed)
Pre visit review using our clinic review tool, if applicable. No additional management support is needed unless otherwise documented below in the visit note. 

## 2014-09-27 NOTE — Progress Notes (Signed)
   Subjective:    Patient ID: [name], male    DOB: 04-16-1961, 54 y.o.   MRN: [id number]  HPI Persistent right index finger pain. Refer to prior note. Back in January he was treated with Keflex. He states that the swelling and soreness had improved but never fully resolved. Now has recurrent swelling.  Intermittent swelling since around November or possibly in October. Denies specific injury. No history of foreign body. Earlier today he expressed some pus. Less painful since the pus came out. No fevers or chills. No history of diabetes.   Review of Systems  Constitutional: Negative for fever and chills.       Objective:   Physical Exam  Constitutional: He appears well-developed and well-nourished.  Cardiovascular: Normal rate and regular rhythm.   Musculoskeletal:  Right index finger reveals some edema and mild erythema dorsally. He has area fluctuance near the base of the nail. No obvious pus on visual inspection. Mildly tender to palpation          Assessment & Plan:  Probable chronic paronychia right index finger. He describes getting some pus out earlier today. Still has some fluctuance.  We recommended digital block and incision and drainage and patient consented. Digital block with 1% plain Xylocaine. Prepping with Betadine. Using #11 blade made a small linear incision near the base of the nail. We did express some minimal purulence. Culture obtained. Start doxycycline 100 mg twice a day for 10 days pending culture. Start saltwater soaks. Antibiotic and dressing applied. Reassess 2 weeks. Consider x-rays at that point if no better

## 2014-09-30 LAB — WOUND CULTURE
GRAM STAIN: NONE SEEN
GRAM STAIN: NONE SEEN

## 2014-10-12 ENCOUNTER — Ambulatory Visit: Payer: Medicare Other | Admitting: Family Medicine

## 2014-10-15 ENCOUNTER — Ambulatory Visit (INDEPENDENT_AMBULATORY_CARE_PROVIDER_SITE_OTHER): Payer: Medicare Other | Admitting: Family Medicine

## 2014-10-15 ENCOUNTER — Encounter: Payer: Self-pay | Admitting: Family Medicine

## 2014-10-15 VITALS — BP 110/70 | HR 68 | Temp 98.3°F | Wt 153.0 lb

## 2014-10-15 DIAGNOSIS — L03011 Cellulitis of right finger: Secondary | ICD-10-CM

## 2014-10-15 MED ORDER — TRIAMCINOLONE ACETONIDE 0.1 % EX CREA: 1.0000 "application " | TOPICAL_CREAM | Freq: Two times a day (BID) | CUTANEOUS | Status: DC

## 2014-10-15 NOTE — Progress Notes (Signed)
Pre visit review using our clinic review tool, if applicable. No additional management support is needed unless otherwise documented below in the visit note. 

## 2014-10-15 NOTE — Patient Instructions (Signed)

## 2014-10-15 NOTE — Progress Notes (Signed)
   Subjective:    Patient ID: [name], male    DOB: [birth year], 54 y.o.   MRN: [id number]  HPI  Patient seen for follow-up regarding chronic right middle finger and inflammation. Refer to prior notes. Last visit we noticed some increased fluctuance and erythema and inflammatory changes. We performed incision and drainage but no significant purulence. Culture was mixed organisms. Patient was placed on doxycycline and finger is feeling much better this time and less erythema and swelling and less tender. He has been soaking this in saltwater soaks. He's not tried any topical creams. Does sometimes get his hands wet with washing dishes  Past Medical History  Diagnosis Date  . SCHIZOPHRENIA, PARANOID, UNSPECIFIED 03/31/2007  . Sebaceous cyst 01/24/2010  . ADVEF, DRUG/MEDICINAL/BIOLOGICAL SUBST NOS 03/31/2007   Past Surgical History  Procedure Laterality Date  . Ankle surgery      fracture    reports that he quit smoking about 15 years ago. His smoking use included Cigarettes. He has a 5 pack-year smoking history. He does not have any smokeless tobacco history on file. He reports that he uses illicit drugs (Marijuana). He reports that he does not drink alcohol. family history includes Cancer in his mother. No Known Allergies   Review of Systems  Constitutional: Negative for fever and chills.       Objective:   Physical Exam  Constitutional: He appears well-developed and well-nourished.  Cardiovascular: Normal rate and regular rhythm.   Pulmonary/Chest: Effort normal and breath sounds normal. No respiratory distress. He has no wheezes. He has no rales.  Skin:  Right middle finger reveals tremendous improvement in terms of less erythema and inflammation compared to 2 weeks ago. He has some minimal swelling along the right middle finger cuticle but no fluctuance and no visible purulence. Nontender          Assessment & Plan:  He has basically what appears to more chronic  paronychia right middle finger. This is improved following doxycycline. We've recommended keeping finger dry at this point. Triamcinolone 0.1% cream twice a day and follow-up promptly for any signs of recurrent infection such as erythema or warmth. We explained these are frequently chronic eczematous process that can become superinfected. Triamcinolone to try to control the inflammatory process.

## 2014-11-15 ENCOUNTER — Ambulatory Visit: Payer: Medicare Other | Admitting: Family Medicine

## 2014-12-22 ENCOUNTER — Ambulatory Visit (INDEPENDENT_AMBULATORY_CARE_PROVIDER_SITE_OTHER): Payer: Medicare Other | Admitting: Family Medicine

## 2014-12-22 ENCOUNTER — Encounter: Payer: Self-pay | Admitting: Family Medicine

## 2014-12-22 VITALS — BP 120/68 | HR 60 | Temp 98.1°F | Wt 154.0 lb

## 2014-12-22 DIAGNOSIS — R300 Dysuria: Secondary | ICD-10-CM | POA: Diagnosis not present

## 2014-12-22 DIAGNOSIS — R350 Frequency of micturition: Secondary | ICD-10-CM

## 2014-12-22 LAB — POCT URINALYSIS DIPSTICK
Bilirubin, UA: NEGATIVE
GLUCOSE UA: NEGATIVE
Ketones, UA: NEGATIVE
Leukocytes, UA: NEGATIVE
NITRITE UA: NEGATIVE
PH UA: 7
PROTEIN UA: NEGATIVE
Spec Grav, UA: 1.02
UROBILINOGEN UA: 0.2

## 2014-12-22 NOTE — Patient Instructions (Signed)

## 2014-12-22 NOTE — Progress Notes (Signed)
Pre visit review using our clinic review tool, if applicable. No additional management support is needed unless otherwise documented below in the visit note. 

## 2014-12-22 NOTE — Progress Notes (Signed)
   Subjective:    Patient ID: [name], male    DOB: [birth year], 54 y.o.   MRN: [id number]  HPI Acute visit for urine frequency. No burning with urination and no slow stream. Duration about 2 years. He drinks about 16 ounces of coffee per day. No alcohol use. Denies any slow stream. No urine incontinence. Limited dairy product consumption. Had PSA about one half years ago normal. No recent fevers or chills. No diuretic use. No recent change of medications.  Past Medical History  Diagnosis Date  . SCHIZOPHRENIA, PARANOID, UNSPECIFIED 03/31/2007  . Sebaceous cyst 01/24/2010  . ADVEF, DRUG/MEDICINAL/BIOLOGICAL SUBST NOS 03/31/2007   Past Surgical History  Procedure Laterality Date  . Ankle surgery      fracture    reports that he quit smoking about 15 years ago. His smoking use included Cigarettes. He has a 5 pack-year smoking history. He does not have any smokeless tobacco history on file. He reports that he uses illicit drugs (Marijuana). He reports that he does not drink alcohol. family history includes Cancer in his mother. No Known Allergies    Review of Systems  Constitutional: Negative for fever and chills.  Genitourinary: Positive for frequency. Negative for dysuria and hematuria.       Objective:   Physical Exam  Constitutional: He appears well-developed and well-nourished.  Cardiovascular: Normal rate and regular rhythm.   Pulmonary/Chest: Effort normal and breath sounds normal. No respiratory distress. He has no wheezes. He has no rales.          Assessment & Plan:  Urine frequency. Dipstick reveals no nitrites or leukocytes. No evidence for infectious source. We have recommended that he reduce caffeine intake. He does not describe any obstructive symptoms. Patient will schedule complete physical and obtain labs including PSA at that point

## 2015-01-20 ENCOUNTER — Other Ambulatory Visit (INDEPENDENT_AMBULATORY_CARE_PROVIDER_SITE_OTHER): Payer: Medicare Other

## 2015-01-20 DIAGNOSIS — Z0000 Encounter for general adult medical examination without abnormal findings: Secondary | ICD-10-CM

## 2015-01-20 LAB — CBC WITH DIFFERENTIAL/PLATELET
BASOS PCT: 0.7 % (ref 0.0–3.0)
Basophils Absolute: 0 10*3/uL (ref 0.0–0.1)
Eosinophils Absolute: 0.1 10*3/uL (ref 0.0–0.7)
Eosinophils Relative: 2.2 % (ref 0.0–5.0)
HEMATOCRIT: 42.2 % (ref 39.0–52.0)
HEMOGLOBIN: 14 g/dL (ref 13.0–17.0)
Lymphocytes Relative: 25 % (ref 12.0–46.0)
Lymphs Abs: 1.4 10*3/uL (ref 0.7–4.0)
MCHC: 33 g/dL (ref 30.0–36.0)
MCV: 78.7 fl (ref 78.0–100.0)
Monocytes Absolute: 0.4 10*3/uL (ref 0.1–1.0)
Monocytes Relative: 7.9 % (ref 3.0–12.0)
NEUTROS ABS: 3.5 10*3/uL (ref 1.4–7.7)
Neutrophils Relative %: 64.2 % (ref 43.0–77.0)
Platelets: 200 10*3/uL (ref 150.0–400.0)
RBC: 5.37 Mil/uL (ref 4.22–5.81)
RDW: 14.7 % (ref 11.5–15.5)
WBC: 5.4 10*3/uL (ref 4.0–10.5)

## 2015-01-20 LAB — LIPID PANEL
CHOL/HDL RATIO: 2
CHOLESTEROL: 156 mg/dL (ref 0–200)
HDL: 74.6 mg/dL (ref >39.00)
LDL CALC: 73 mg/dL (ref 0–99)
NonHDL: 81.4
Triglycerides: 42 mg/dL (ref 0.0–149.0)
VLDL: 8.4 mg/dL (ref 0.0–40.0)

## 2015-01-20 LAB — PSA: PSA: 2.3 ng/mL (ref 0.10–4.00)

## 2015-01-20 LAB — COMPREHENSIVE METABOLIC PANEL
ALBUMIN: 4.4 g/dL (ref 3.5–5.2)
ALK PHOS: 54 U/L (ref 39–117)
ALT: 13 U/L (ref 0–53)
AST: 21 U/L (ref 0–37)
BUN: 6 mg/dL (ref 6–23)
CHLORIDE: 105 meq/L (ref 96–112)
CO2: 29 mEq/L (ref 19–32)
CREATININE: 1.25 mg/dL (ref 0.40–1.50)
Calcium: 9.3 mg/dL (ref 8.4–10.5)
GFR: 77.67 mL/min (ref >60.00)
GLUCOSE: 95 mg/dL (ref 70–99)
Potassium: 4.3 mEq/L (ref 3.5–5.1)
SODIUM: 138 meq/L (ref 135–145)
Total Bilirubin: 0.9 mg/dL (ref 0.2–1.2)
Total Protein: 6.5 g/dL (ref 6.0–8.3)

## 2015-01-20 LAB — TSH: TSH: 0.94 u[IU]/mL (ref 0.35–4.50)

## 2015-01-27 ENCOUNTER — Encounter: Payer: Self-pay | Admitting: Family Medicine

## 2015-02-02 ENCOUNTER — Ambulatory Visit (INDEPENDENT_AMBULATORY_CARE_PROVIDER_SITE_OTHER): Payer: Medicare Other | Admitting: Family Medicine

## 2015-02-02 ENCOUNTER — Encounter: Payer: Self-pay | Admitting: Family Medicine

## 2015-02-02 VITALS — BP 120/80 | HR 60 | Temp 98.5°F | Ht 69.0 in | Wt 150.0 lb

## 2015-02-02 DIAGNOSIS — Z0000 Encounter for general adult medical examination without abnormal findings: Secondary | ICD-10-CM | POA: Diagnosis not present

## 2015-02-02 DIAGNOSIS — R972 Elevated prostate specific antigen [PSA]: Secondary | ICD-10-CM

## 2015-02-02 NOTE — Progress Notes (Signed)
Pre visit review using our clinic review tool, if applicable. No additional management support is needed unless otherwise documented below in the visit note. 

## 2015-02-02 NOTE — Progress Notes (Signed)
   Subjective:    Patient ID: [name], male    DOB: Dec 26, 1960, 54 y.o.   MRN: [id number]  HPI Patient seen for complete physical. He has history of schizophrenia which is followed by mental health. He is maintained on Risperdal.  Takes no other medications. Tetanus up-to-date. Nonsmoker. Stays quite active. No specific complaints today.  Past Medical History  Diagnosis Date  . SCHIZOPHRENIA, PARANOID, UNSPECIFIED 03/31/2007  . Sebaceous cyst 01/24/2010  . ADVEF, DRUG/MEDICINAL/BIOLOGICAL SUBST NOS 03/31/2007   Past Surgical History  Procedure Laterality Date  . Ankle surgery      fracture    reports that he quit smoking about 16 years ago. His smoking use included Cigarettes. He has a 5 pack-year smoking history. He does not have any smokeless tobacco history on file. He reports that he uses illicit drugs (Marijuana). He reports that he does not drink alcohol. family history includes Cancer in his mother. No Known Allergies    Review of Systems  Constitutional: Negative for fever, activity change, appetite change and fatigue.  HENT: Negative for congestion, ear pain and trouble swallowing.   Eyes: Negative for pain and visual disturbance.  Respiratory: Negative for cough, shortness of breath and wheezing.   Cardiovascular: Negative for chest pain and palpitations.  Gastrointestinal: Negative for nausea, vomiting, abdominal pain, diarrhea, constipation, blood in stool, abdominal distention and rectal pain.  Genitourinary: Negative for dysuria, hematuria and testicular pain.  Musculoskeletal: Negative for joint swelling and arthralgias.  Skin: Negative for rash.  Neurological: Negative for dizziness, syncope and headaches.  Hematological: Negative for adenopathy.  Psychiatric/Behavioral: Negative for confusion and dysphoric mood.       Objective:   Physical Exam  Constitutional: He is oriented to person, place, and time. He appears well-developed and well-nourished. No  distress.  HENT:  Head: Normocephalic and atraumatic.  Right Ear: External ear normal.  Left Ear: External ear normal.  Mouth/Throat: Oropharynx is clear and moist.  Eyes: Conjunctivae and EOM are normal. Pupils are equal, round, and reactive to light.  Neck: Normal range of motion. Neck supple. No thyromegaly present.  Cardiovascular: Normal rate, regular rhythm and normal heart sounds.   No murmur heard. Pulmonary/Chest: No respiratory distress. He has no wheezes. He has no rales.  Abdominal: Soft. Bowel sounds are normal. He exhibits no distension and no mass. There is no tenderness. There is no rebound and no guarding.  Genitourinary: Rectum normal and prostate normal.  Musculoskeletal: He exhibits no edema.  Lymphadenopathy:    He has no cervical adenopathy.  Neurological: He is alert and oriented to person, place, and time. He displays normal reflexes. No cranial nerve deficit.  Skin: No rash noted.  Psychiatric: He has a normal mood and affect.          Assessment & Plan:  Complete physical. Labs reviewed. PSA is normal but went up over 1. from last year. We'll plan repeat PSA in 4 months. Other labs are stable and lipids are improved. His colonoscopy up-to-date. Tetanus up-to-date.

## 2015-06-01 ENCOUNTER — Other Ambulatory Visit (INDEPENDENT_AMBULATORY_CARE_PROVIDER_SITE_OTHER): Payer: Medicare Other

## 2015-06-01 DIAGNOSIS — R972 Elevated prostate specific antigen [PSA]: Secondary | ICD-10-CM | POA: Diagnosis not present

## 2015-06-01 LAB — TSH: TSH: 0.77 u[IU]/mL (ref 0.35–4.50)

## 2015-06-01 LAB — PSA: PSA: 2.14 ng/mL (ref 0.10–4.00)

## 2015-06-02 ENCOUNTER — Other Ambulatory Visit: Payer: Self-pay

## 2015-06-07 ENCOUNTER — Encounter: Payer: Self-pay | Admitting: Gastroenterology

## 2015-06-17 ENCOUNTER — Telehealth: Payer: Self-pay | Admitting: Family Medicine

## 2015-06-17 NOTE — Telephone Encounter (Signed)
Error/njr °

## 2015-10-14 ENCOUNTER — Ambulatory Visit (INDEPENDENT_AMBULATORY_CARE_PROVIDER_SITE_OTHER): Payer: Medicare Other | Admitting: Family Medicine

## 2015-10-14 ENCOUNTER — Encounter: Payer: Self-pay | Admitting: Family Medicine

## 2015-10-14 VITALS — BP 80/60 | HR 111 | Temp 102.5°F | Ht 69.0 in | Wt 153.0 lb

## 2015-10-14 DIAGNOSIS — R509 Fever, unspecified: Secondary | ICD-10-CM

## 2015-10-14 MED ORDER — OSELTAMIVIR PHOSPHATE 75 MG PO CAPS: 75.0000 mg | ORAL_CAPSULE | Freq: Two times a day (BID) | ORAL | Status: DC

## 2015-10-14 NOTE — Progress Notes (Signed)
Pre visit review using our clinic review tool, if applicable. No additional management support is needed unless otherwise documented below in the visit note. 

## 2015-10-14 NOTE — Patient Instructions (Signed)

## 2015-10-14 NOTE — Progress Notes (Signed)
   Subjective:    Patient ID: [name], male    DOB: 06/27/1961, 55 y.o.   MRN: [id number]  HPI  onset 2 days ago of body aches , cough , sore throat  Sore throat is somewhat better today. He's had fever intermittently with chills  Occasional headaches. Denies any nausea, vomiting, or diarrhea.  Girlfriend diagnosed with influenza yesterday.  His symptoms are very similar. He has taken some Motrin for fever relief.  Denies abdominal pain or dysuria. No dyspnea.  Past Medical History  Diagnosis Date  . SCHIZOPHRENIA, PARANOID, UNSPECIFIED 03/31/2007  . Sebaceous cyst 01/24/2010  . ADVEF, DRUG/MEDICINAL/BIOLOGICAL SUBST NOS 03/31/2007   Past Surgical History  Procedure Laterality Date  . Ankle surgery      fracture    reports that he quit smoking about 16 years ago. His smoking use included Cigarettes. He has a 5 pack-year smoking history. He does not have any smokeless tobacco history on file. He reports that he uses illicit drugs (Marijuana). He reports that he does not drink alcohol. family history includes Cancer in his mother. No Known Allergies    Review of Systems  Constitutional: Positive for fever, chills and fatigue.  HENT: Positive for congestion.   Respiratory: Positive for cough.   Gastrointestinal: Negative for nausea, vomiting and abdominal pain.  Genitourinary: Negative for dysuria.  Neurological: Positive for headaches.       Objective:   Physical Exam  Constitutional: He appears well-developed and well-nourished. No distress.  HENT:  Right Ear: External ear normal.  Left Ear: External ear normal.  Minimal posterior pharynx erythema without exudate  Neck: Neck supple.  Cardiovascular: Normal rate and regular rhythm.   Pulmonary/Chest: Effort normal and breath sounds normal. No respiratory distress. He has no wheezes. He has no rales.  Lymphadenopathy:    He has no cervical adenopathy.          Assessment & Plan:   Febrile illness. Suspect  influenza. Start Tamiflu 75 mg twice daily for 5 days. Stay well-hydrated. Continue Motrin as needed for fever and body aches

## 2016-02-13 ENCOUNTER — Telehealth: Payer: Self-pay | Admitting: Family Medicine

## 2016-02-13 ENCOUNTER — Other Ambulatory Visit (INDEPENDENT_AMBULATORY_CARE_PROVIDER_SITE_OTHER): Payer: Medicare Other

## 2016-02-13 DIAGNOSIS — Z0000 Encounter for general adult medical examination without abnormal findings: Secondary | ICD-10-CM

## 2016-02-13 DIAGNOSIS — Z125 Encounter for screening for malignant neoplasm of prostate: Secondary | ICD-10-CM

## 2016-02-13 LAB — BASIC METABOLIC PANEL
BUN: 9 mg/dL (ref 6–23)
CHLORIDE: 105 meq/L (ref 96–112)
CO2: 29 mEq/L (ref 19–32)
Calcium: 9.4 mg/dL (ref 8.4–10.5)
Creatinine, Ser: 1.09 mg/dL (ref 0.40–1.50)
GFR: 90.6 mL/min (ref >60.00)
GLUCOSE: 101 mg/dL — AB (ref 70–99)
POTASSIUM: 4.1 meq/L (ref 3.5–5.1)
SODIUM: 140 meq/L (ref 135–145)

## 2016-02-13 LAB — HEPATIC FUNCTION PANEL
ALBUMIN: 4.3 g/dL (ref 3.5–5.2)
ALT: 16 U/L (ref 0–53)
AST: 19 U/L (ref 0–37)
Alkaline Phosphatase: 64 U/L (ref 39–117)
Bilirubin, Direct: 0.1 mg/dL (ref 0.0–0.3)
TOTAL PROTEIN: 6.4 g/dL (ref 6.0–8.3)
Total Bilirubin: 0.6 mg/dL (ref 0.2–1.2)

## 2016-02-13 LAB — CBC WITH DIFFERENTIAL/PLATELET
Basophils Absolute: 0 10*3/uL (ref 0.0–0.1)
Basophils Relative: 0.7 % (ref 0.0–3.0)
EOS PCT: 1.9 % (ref 0.0–5.0)
Eosinophils Absolute: 0.1 10*3/uL (ref 0.0–0.7)
HCT: 42.3 % (ref 39.0–52.0)
HEMOGLOBIN: 13.8 g/dL (ref 13.0–17.0)
Lymphocytes Relative: 25.8 % (ref 12.0–46.0)
Lymphs Abs: 1.5 10*3/uL (ref 0.7–4.0)
MCHC: 32.6 g/dL (ref 30.0–36.0)
MCV: 79.6 fl (ref 78.0–100.0)
MONOS PCT: 6.6 % (ref 3.0–12.0)
Monocytes Absolute: 0.4 10*3/uL (ref 0.1–1.0)
Neutro Abs: 3.7 10*3/uL (ref 1.4–7.7)
Neutrophils Relative %: 65 % (ref 43.0–77.0)
Platelets: 211 10*3/uL (ref 150.0–400.0)
RBC: 5.31 Mil/uL (ref 4.22–5.81)
RDW: 13.6 % (ref 11.5–15.5)
WBC: 5.7 10*3/uL (ref 4.0–10.5)

## 2016-02-13 LAB — PSA: PSA: 1.91 ng/mL (ref 0.10–4.00)

## 2016-02-13 LAB — LIPID PANEL
CHOLESTEROL: 185 mg/dL (ref 0–200)
HDL: 79.5 mg/dL (ref >39.00)
LDL Cholesterol: 95 mg/dL (ref 0–99)
NonHDL: 105.05
Total CHOL/HDL Ratio: 2
Triglycerides: 48 mg/dL (ref 0.0–149.0)
VLDL: 9.6 mg/dL (ref 0.0–40.0)

## 2016-02-13 LAB — TSH: TSH: 1.17 u[IU]/mL (ref 0.35–4.50)

## 2016-02-13 NOTE — Telephone Encounter (Signed)
He has schizophrenia so he will need to continue to see a psychiatrist for med management.  Make sure he is aware that Dr Jason FilaBray does not do med management.  She would be available for counseling only.

## 2016-02-13 NOTE — Telephone Encounter (Signed)
Patient states he wants to start seeing Dr Jason FilaBray.  His present behavioral dr is refusing to release his records because they don't want to lose him as a patient.  He wants to be referred to Dr Jason FilaBray.

## 2016-02-13 NOTE — Telephone Encounter (Signed)
Left message for pt to call back  °

## 2016-02-13 NOTE — Telephone Encounter (Signed)
Pt recently seen on 10/14/2015 Okay to refer to Dr. Jason FilaBray?

## 2016-02-14 NOTE — Telephone Encounter (Signed)
Spoke to patient. Gave info per Dr. Lucie LeatherBurchette's previous note. Patient verbalized understanding.

## 2016-02-14 NOTE — Telephone Encounter (Signed)
Pt returned your call.  

## 2016-02-22 ENCOUNTER — Encounter: Payer: Self-pay | Admitting: Family Medicine

## 2016-05-04 ENCOUNTER — Ambulatory Visit (INDEPENDENT_AMBULATORY_CARE_PROVIDER_SITE_OTHER): Payer: Medicare Other | Admitting: Family Medicine

## 2016-05-04 ENCOUNTER — Encounter: Payer: Self-pay | Admitting: Family Medicine

## 2016-05-04 VITALS — BP 100/80 | HR 86 | Temp 98.1°F | Ht 69.0 in | Wt 151.3 lb

## 2016-05-04 DIAGNOSIS — Z0000 Encounter for general adult medical examination without abnormal findings: Secondary | ICD-10-CM

## 2016-05-04 NOTE — Progress Notes (Signed)
Subjective:     Patient ID: [name], male   DOB: Mar 02, 1961, 55 y.o.   MRN: [id number]  HPI Physical exam. Patient has history of schizophrenia followed by mental health. He has family history of colon cancer in his father. His last colonoscopy was 2011 and normal with recommended five-year follow-up and he is overdue by one year. He requests going in December for follow-up visit. His previous gastroenterologist has retired. He states he feels well. He exercises with running few days per week. Nonsmoker. Quit back in 2000. No alcohol use.  Past Medical History:  Diagnosis Date  . ADVEF, DRUG/MEDICINAL/BIOLOGICAL SUBST NOS 03/31/2007  . SCHIZOPHRENIA, PARANOID, UNSPECIFIED 03/31/2007  . Sebaceous cyst 01/24/2010   Past Surgical History:  Procedure Laterality Date  . ANKLE SURGERY     fracture    reports that he quit smoking about 17 years ago. His smoking use included Cigarettes. He has a 5.00 pack-year smoking history. He has never used smokeless tobacco. He reports that he uses drugs, including Marijuana. He reports that he does not drink alcohol. family history includes Cancer in his mother; Cancer - Colon in his father. No Known Allergies   Review of Systems  Constitutional: Negative for activity change, appetite change, fatigue and fever.  HENT: Negative for congestion, ear pain and trouble swallowing.   Eyes: Negative for pain and visual disturbance.  Respiratory: Negative for cough, shortness of breath and wheezing.   Cardiovascular: Negative for chest pain and palpitations.  Gastrointestinal: Negative for abdominal distention, abdominal pain, blood in stool, constipation, diarrhea, nausea, rectal pain and vomiting.  Genitourinary: Negative for dysuria, hematuria and testicular pain.  Musculoskeletal: Negative for arthralgias and joint swelling.  Skin: Negative for rash.  Neurological: Negative for dizziness, syncope and headaches.  Hematological: Negative for adenopathy.   Psychiatric/Behavioral: Negative for confusion and dysphoric mood.       Objective:   Physical Exam  Constitutional: He is oriented to person, place, and time. He appears well-developed and well-nourished. No distress.  HENT:  Head: Normocephalic and atraumatic.  Right Ear: External ear normal.  Left Ear: External ear normal.  Mouth/Throat: Oropharynx is clear and moist.  Eyes: Conjunctivae and EOM are normal. Pupils are equal, round, and reactive to light.  Neck: Normal range of motion. Neck supple. No thyromegaly present.  Cardiovascular: Normal rate, regular rhythm and normal heart sounds.   No murmur heard. Pulmonary/Chest: No respiratory distress. He has no wheezes. He has no rales.  Abdominal: Soft. Bowel sounds are normal. He exhibits no distension and no mass. There is no tenderness. There is no rebound and no guarding.  Musculoskeletal: He exhibits no edema.  Lymphadenopathy:    He has no cervical adenopathy.  Neurological: He is alert and oriented to person, place, and time. He displays normal reflexes. No cranial nerve deficit.  Skin: No rash noted.  Psychiatric: He has a normal mood and affect.       Assessment:     Physical exam    Plan:     -Labs reviewed with no major concern -Flu vaccine offered and declined -Needs follow-up colonoscopy secondary to family history -continue regular exercise habits.  Kristian CoveyBruce W Heaven Meeker MD [facility]

## 2016-05-04 NOTE — Progress Notes (Signed)
Pre visit review using our clinic review tool, if applicable. No additional management support is needed unless otherwise documented below in the visit note. 

## 2016-05-04 NOTE — Patient Instructions (Signed)
We will call you with GI referral. 

## 2016-06-12 ENCOUNTER — Encounter: Payer: Self-pay | Admitting: Gastroenterology

## 2016-06-12 ENCOUNTER — Ambulatory Visit (INDEPENDENT_AMBULATORY_CARE_PROVIDER_SITE_OTHER): Payer: Medicare Other | Admitting: Gastroenterology

## 2016-06-12 VITALS — BP 90/64 | HR 56 | Ht 69.0 in | Wt 156.0 lb

## 2016-06-12 DIAGNOSIS — Z800 Family history of malignant neoplasm of digestive organs: Secondary | ICD-10-CM | POA: Diagnosis not present

## 2016-06-12 MED ORDER — NA SULFATE-K SULFATE-MG SULF 17.5-3.13-1.6 GM/177ML PO SOLN: 1.0000 | Freq: Once | ORAL | 0 refills | Status: AC

## 2016-06-12 NOTE — Patient Instructions (Signed)
You will be set up for a colonoscopy for FH of colon cancer (father).

## 2016-06-12 NOTE — Progress Notes (Signed)
Review of pertinent gastrointestinal problems: 1. Family history of colon cancer;  colonoscopy Dr. Sheryn Bisonavid Patterson October 2011 found no polyps or cancers. He recommended repeat colonoscopy at five-year interval due to "significant family history of colon cancer"  HPI: This is a  very pleasant 55 year old man  with a family history of cancer   Chief complaint is family history of cancer   He explained to our intake assistant here with his dad did not have colon cancer and that he actually had stomach cancer, possibly bone cancer. He tells me however that his father did have colon cancer and he has "pretty sure about".   His father  Was treated in [place].  He was diagnosed in his 4960s  No GI issues.  No overt bleeding, no constipation or change in his bowels.  One year ago, I reviewed Dr. Norval GablePatterson's colonoscopy report from 2011. I was unable to tell based on that report or from anything in epic with the significant family history was and so I requested he come to the office to clarify his family history   Review of systems: Pertinent positive and negative review of systems were noted in the above HPI section. Complete review of systems was performed and was otherwise normal.   Past Medical History:  Diagnosis Date  . ADVEF, DRUG/MEDICINAL/BIOLOGICAL SUBST NOS 03/31/2007  . SCHIZOPHRENIA, PARANOID, UNSPECIFIED 03/31/2007  . Sebaceous cyst 01/24/2010    Past Surgical History:  Procedure Laterality Date  . ANKLE SURGERY     fracture    Current Outpatient Prescriptions  Medication Sig Dispense Refill  . Garlic 500 MG TABS Take 1 tablet by mouth daily.    . Multiple Vitamin (MULTIVITAMIN WITH MINERALS) TABS tablet Take 1 tablet by mouth daily.    . risperiDONE (RISPERDAL) 3 MG tablet Take 3 mg by mouth daily.     No current facility-administered medications for this visit.     Allergies as of 06/12/2016  . (No Known Allergies)    Family History  Problem Relation Age of Onset  .  Cancer Mother     stomach  . Cancer - Colon Father     Social History   Social History  . Marital status: Single    Spouse name: N/A  . Number of children: N/A  . Years of education: N/A   Occupational History  . Not on file.   Social History Main Topics  . Smoking status: Former Smoker    Packs/day: 0.50    Years: 10.00    Types: Cigarettes    Quit date: 01/08/1999  . Smokeless tobacco: Never Used  . Alcohol use No  . Drug use:     Types: Marijuana     Comment: former user of marijuana  . Sexual activity: Not on file   Other Topics Concern  . Not on file   Social History Narrative  . No narrative on file     Physical Exam: BP 90/64   Pulse (!) 56   Ht 5\' 9"  (1.753 m)   Wt 156 lb (70.8 kg)   BMI 23.04 kg/m  Constitutional: generally well-appearing Psychiatric: alert and oriented x3 Eyes: extraocular movements intact Mouth: oral pharynx moist, no lesions Neck: supple no lymphadenopathy Cardiovascular: heart regular rate and rhythm Lungs: clear to auscultation bilaterally Abdomen: soft, nontender, nondistended, no obvious ascites, no peritoneal signs, normal bowel sounds Extremities: no lower extremity edema bilaterally Skin: no lesions on visible extremities   Assessment and plan: 55 y.o. male with  "Probable"  family history of colon cancer   he has paranoid schizophrenia and his family history has changed even during this visit. At first he said his dad had stomach cancer but then he told me he is pretty sure his dad had colon cancer. I think we should just simply assume his father did have colon cancer and that would put him at slightly increased risk for cancer himself inside recommended repeat colonoscopy for colon cancer screening at this point and then every 5 years colon cancer screening after that.  Jonathan Bunting, MD [facility] 06/12/2016, 10:34 AM  Cc: Jonathan Covey, MD

## 2016-08-22 ENCOUNTER — Encounter: Payer: Self-pay | Admitting: Gastroenterology

## 2016-10-04 ENCOUNTER — Telehealth: Payer: Self-pay | Admitting: Gastroenterology

## 2016-10-04 NOTE — Telephone Encounter (Signed)
Dr Christella HartiganJacobs do you want to charge for late cancellation?

## 2016-10-04 NOTE — Telephone Encounter (Signed)
yes

## 2016-10-05 ENCOUNTER — Encounter: Payer: Medicare Other | Admitting: Gastroenterology

## 2016-10-29 DIAGNOSIS — F209 Schizophrenia, unspecified: Secondary | ICD-10-CM | POA: Diagnosis not present

## 2016-12-19 NOTE — Telephone Encounter (Signed)
Due to her [employer]; Unable to bill same day cx fee.

## 2017-02-07 DIAGNOSIS — F209 Schizophrenia, unspecified: Secondary | ICD-10-CM | POA: Diagnosis not present

## 2017-03-01 ENCOUNTER — Encounter: Payer: Self-pay | Admitting: Family Medicine

## 2017-05-02 ENCOUNTER — Encounter: Payer: Self-pay | Admitting: Family Medicine

## 2017-05-02 ENCOUNTER — Ambulatory Visit (INDEPENDENT_AMBULATORY_CARE_PROVIDER_SITE_OTHER): Payer: PPO | Admitting: Family Medicine

## 2017-05-02 VITALS — BP 110/72 | HR 60 | Temp 98.5°F | Ht 69.0 in | Wt 137.0 lb

## 2017-05-02 DIAGNOSIS — K1370 Unspecified lesions of oral mucosa: Secondary | ICD-10-CM | POA: Diagnosis not present

## 2017-05-02 NOTE — Patient Instructions (Signed)
-  We placed a referral for you as discussed. It usually takes a few days to process this referral and schedule this appointment. If you have not heard from us regarding this appointment in 5-7 days please call our office.

## 2017-05-02 NOTE — Progress Notes (Signed)
  HPI:  Acute visit for oral pain x 2-3 months: -L lower vestibule -pain here for several months -upper and lower dentures -he has felt lesion here as well for about a month or so -hx tobacco use  ROS: See pertinent positives and negatives per HPI.  Past Medical History:  Diagnosis Date  . ADVEF, DRUG/MEDICINAL/BIOLOGICAL SUBST NOS 03/31/2007  . SCHIZOPHRENIA, PARANOID, UNSPECIFIED 03/31/2007  . Sebaceous cyst 01/24/2010    Past Surgical History:  Procedure Laterality Date  . ANKLE SURGERY     fracture    Family History  Problem Relation Age of Onset  . Cancer Mother        stomach  . Cancer - Colon Father     Social History   Social History  . Marital status: Single    Spouse name: N/A  . Number of children: N/A  . Years of education: N/A   Social History Main Topics  . Smoking status: Former Smoker    Packs/day: 0.50    Years: 10.00    Types: Cigarettes    Quit date: 01/08/1999  . Smokeless tobacco: Never Used  . Alcohol use No  . Drug use: Yes    Types: Marijuana     Comment: former user of marijuana  . Sexual activity: Not Asked   Other Topics Concern  . None   Social History Narrative  . None     Current Outpatient Prescriptions:  .  Garlic 500 MG TABS, Take 1 tablet by mouth daily., Disp: , Rfl:  .  Multiple Vitamin (MULTIVITAMIN WITH MINERALS) TABS tablet, Take 1 tablet by mouth daily., Disp: , Rfl:  .  risperiDONE (RISPERDAL) 3 MG tablet, Take 3 mg by mouth daily., Disp: , Rfl:   EXAM:  Vitals:   05/02/17 1705  BP: 110/72  Pulse: 60  Temp: 98.5 F (36.9 C)    Body mass index is 20.23 kg/m.  GENERAL: vitals reviewed and listed above, alert, oriented, appears well hydrated and in no acute distress  HEENT: atraumatic, conjunttiva clear, no obvious abnormalities on inspection of external nose and ears, dentures - somewhat densely textured, sl raised, irr lesion L lower mouth between gum and cheek  NECK: no obvious masses on  inspection  LUNGS: clear to auscultation bilaterally, no wheezes, rales or rhonchi, good air movement  CV: HRRR, no peripheral edema  MS: moves all extremities without noticeable abnormality  PSYCH: pleasant and cooperative, no obvious depression or anxiety  ASSESSMENT AND PLAN:  Discussed the following assessment and plan:  Lesion of oral mucosa - Plan: Ambulatory referral to Oral Maxillofacial Surgery  -oral lesion, several months, hx tobacco use -needs eval with specialist for likely biopsy - discussed potential etiologies with pt, referral placed -advised it is very important that he contact us in 5-7 days if detail referral not given at that point -Patient advised to return or notify a doctor immediately if symptoms worsen or persist or new concerns arise.  Patient Instructions  -We placed a referral for you as discussed. It usually takes a few days to process this referral and schedule this appointment. If you have not heard from us regarding this appointment in 5-7 days please call our office.    Kriste BasqueKIM, Terrence Pizana R., DO

## 2017-05-07 DIAGNOSIS — F209 Schizophrenia, unspecified: Secondary | ICD-10-CM | POA: Diagnosis not present

## 2017-05-09 ENCOUNTER — Encounter: Payer: Self-pay | Admitting: Family Medicine

## 2017-05-28 ENCOUNTER — Encounter: Payer: Self-pay | Admitting: Family Medicine

## 2017-05-28 ENCOUNTER — Ambulatory Visit (INDEPENDENT_AMBULATORY_CARE_PROVIDER_SITE_OTHER): Payer: PPO | Admitting: Family Medicine

## 2017-05-28 VITALS — BP 100/70 | HR 64 | Temp 97.9°F | Resp 16 | Ht 70.0 in | Wt 136.9 lb

## 2017-05-28 DIAGNOSIS — H6123 Impacted cerumen, bilateral: Secondary | ICD-10-CM | POA: Diagnosis not present

## 2017-05-28 DIAGNOSIS — Z23 Encounter for immunization: Secondary | ICD-10-CM

## 2017-05-28 DIAGNOSIS — Z125 Encounter for screening for malignant neoplasm of prostate: Secondary | ICD-10-CM

## 2017-05-28 DIAGNOSIS — Z0000 Encounter for general adult medical examination without abnormal findings: Secondary | ICD-10-CM

## 2017-05-28 DIAGNOSIS — R972 Elevated prostate specific antigen [PSA]: Secondary | ICD-10-CM | POA: Diagnosis not present

## 2017-05-28 DIAGNOSIS — Z0001 Encounter for general adult medical examination with abnormal findings: Secondary | ICD-10-CM | POA: Diagnosis not present

## 2017-05-28 LAB — CBC WITH DIFFERENTIAL/PLATELET
BASOS ABS: 0 10*3/uL (ref 0.0–0.1)
BASOS PCT: 0.5 % (ref 0.0–3.0)
Eosinophils Absolute: 0.1 10*3/uL (ref 0.0–0.7)
Eosinophils Relative: 1.5 % (ref 0.0–5.0)
HCT: 41.3 % (ref 39.0–52.0)
Hemoglobin: 13.8 g/dL (ref 13.0–17.0)
Lymphocytes Relative: 17.8 % (ref 12.0–46.0)
Lymphs Abs: 1 10*3/uL (ref 0.7–4.0)
MCHC: 33.4 g/dL (ref 30.0–36.0)
MCV: 78.3 fl (ref 78.0–100.0)
MONOS PCT: 7.3 % (ref 3.0–12.0)
Monocytes Absolute: 0.4 10*3/uL (ref 0.1–1.0)
NEUTROS PCT: 72.9 % (ref 43.0–77.0)
Neutro Abs: 4.3 10*3/uL (ref 1.4–7.7)
Platelets: 254 10*3/uL (ref 150.0–400.0)
RBC: 5.27 Mil/uL (ref 4.22–5.81)
RDW: 14 % (ref 11.5–15.5)
WBC: 5.8 10*3/uL (ref 4.0–10.5)

## 2017-05-28 LAB — TSH: TSH: 0.71 u[IU]/mL (ref 0.35–4.50)

## 2017-05-28 LAB — HEPATIC FUNCTION PANEL
ALBUMIN: 4.4 g/dL (ref 3.5–5.2)
ALT: 12 U/L (ref 0–53)
AST: 17 U/L (ref 0–37)
Alkaline Phosphatase: 60 U/L (ref 39–117)
BILIRUBIN DIRECT: 0.1 mg/dL (ref 0.0–0.3)
TOTAL PROTEIN: 6.4 g/dL (ref 6.0–8.3)
Total Bilirubin: 0.7 mg/dL (ref 0.2–1.2)

## 2017-05-28 LAB — BASIC METABOLIC PANEL
BUN: 7 mg/dL (ref 6–23)
CHLORIDE: 106 meq/L (ref 96–112)
CO2: 29 meq/L (ref 19–32)
Calcium: 9.3 mg/dL (ref 8.4–10.5)
Creatinine, Ser: 0.93 mg/dL (ref 0.40–1.50)
GFR: 108.3 mL/min (ref >60.00)
Glucose, Bld: 94 mg/dL (ref 70–99)
Potassium: 4.1 mEq/L (ref 3.5–5.1)
SODIUM: 141 meq/L (ref 135–145)

## 2017-05-28 LAB — LIPID PANEL
CHOL/HDL RATIO: 2
Cholesterol: 167 mg/dL (ref 0–200)
HDL: 80.9 mg/dL (ref >39.00)
LDL CALC: 79 mg/dL (ref 0–99)
NonHDL: 86.58
TRIGLYCERIDES: 38 mg/dL (ref 0.0–149.0)
VLDL: 7.6 mg/dL (ref 0.0–40.0)

## 2017-05-28 LAB — PSA: PSA: 3.16 ng/mL (ref 0.10–4.00)

## 2017-05-28 NOTE — Progress Notes (Signed)
Subjective:     Patient ID: [name], male   DOB: October 16, 1960, 56 y.o.   MRN: [id number]  HPI Patient seen for physical exam. His chronic problems and history of paranoid schizophrenia. He is followed by mental health and medications are reviewed. He had recent addition of BuSpar. His weight is down some from last year he attributes this to dietary changes. He states is a more fruits and vegetables. Somewhat less exercise. He states he has good appetite and feels actually better overall. Has had some issues with decreased hearing. He is scheduling to get audiometry assessment. Needs flu vaccine. No history of hepatitis C testing. Overall low risk. No history of shingles vaccine. Colonoscopy 2011.  Patient is nonsmoker. No alcohol use.  Past Medical History:  Diagnosis Date  . ADVEF, DRUG/MEDICINAL/BIOLOGICAL SUBST NOS 03/31/2007  . SCHIZOPHRENIA, PARANOID, UNSPECIFIED 03/31/2007  . Sebaceous cyst 01/24/2010   Past Surgical History:  Procedure Laterality Date  . ANKLE SURGERY     fracture    reports that he quit smoking about 18 years ago. His smoking use included Cigarettes. He has a 5.00 pack-year smoking history. He has never used smokeless tobacco. He reports that he uses drugs, including Marijuana. He reports that he does not drink alcohol. family history includes Cancer in his mother; Cancer - Colon in his father. No Known Allergies    Review of Systems  Constitutional: Negative for activity change, appetite change, fatigue and fever.  HENT: Positive for hearing loss. Negative for congestion, ear pain and trouble swallowing.   Eyes: Negative for pain and visual disturbance.  Respiratory: Negative for cough, shortness of breath and wheezing.   Cardiovascular: Negative for chest pain and palpitations.  Gastrointestinal: Negative for abdominal distention, abdominal pain, blood in stool, constipation, diarrhea, nausea, rectal pain and vomiting.  Endocrine: Negative for polydipsia  and polyuria.  Genitourinary: Negative for dysuria, hematuria and testicular pain.  Musculoskeletal: Negative for arthralgias and joint swelling.  Skin: Negative for rash.  Neurological: Negative for dizziness, syncope and headaches.  Hematological: Negative for adenopathy.  Psychiatric/Behavioral: Negative for confusion and dysphoric mood.       Objective:   Physical Exam  Constitutional: He is oriented to person, place, and time. He appears well-developed and well-nourished. No distress.  HENT:  Head: Normocephalic and atraumatic.  Mouth/Throat: Oropharynx is clear and moist.  Patient has cerumen impactions bilaterally obscuring eardrums  Cerumen removed following irrigation. His ear canals had been fully impacted prior to irrigation and cerumen was removed with visualization of ear drums which appear normal  Eyes: Pupils are equal, round, and reactive to light. Conjunctivae and EOM are normal.  Neck: Normal range of motion. Neck supple. No thyromegaly present.  Cardiovascular: Normal rate, regular rhythm and normal heart sounds.   No murmur heard. Pulmonary/Chest: No respiratory distress. He has no wheezes. He has no rales.  Abdominal: Soft. Bowel sounds are normal. He exhibits no distension and no mass. There is no tenderness. There is no rebound and no guarding.  Musculoskeletal: He exhibits no edema.  Lymphadenopathy:    He has no cervical adenopathy.  Neurological: He is alert and oriented to person, place, and time. He displays normal reflexes. No cranial nerve deficit.  Skin: No rash noted.  Psychiatric: He has a normal mood and affect.       Assessment:     Physical exam. Patient has had some decreased hearing which may be partly attributable to cerumen impactions bilaterally and this will be removed as below.  Addressed several health maintenance items as below     Plan:     -Flu vaccine given  -Check on coverage for shingles vaccine  -Colonoscopy up-to-date and  will need repeat about 3 years  -Obtain screening lab work including hepatitis C antibody and HIV antibody  -The natural history of prostate cancer and ongoing controversy regarding screening and potential treatment outcomes of prostate cancer has been discussed with the patient. The meaning of a false positive PSA and a false negative PSA has been discussed. He indicates understanding of the limitations of this screening test and wishes to proceed with screening PSA testing. -We'll encourage him to go ahead with audiometry screening especially if hearing does not seem to improve with irrigation  Kristian Covey MD [facility]

## 2017-05-28 NOTE — Patient Instructions (Signed)
Consider Shingrix vaccine- check on insurance coverage.

## 2017-05-29 LAB — HEPATITIS C ANTIBODY
Hepatitis C Ab: NONREACTIVE
SIGNAL TO CUT-OFF: 0.01 (ref <1.00)

## 2017-05-29 LAB — HIV ANTIBODY (ROUTINE TESTING W REFLEX): HIV: NONREACTIVE

## 2017-05-30 NOTE — Addendum Note (Signed)
Addended by: Johnella Moloney on: 05/30/2017 02:40 PM   Modules accepted: Orders

## 2017-08-05 DIAGNOSIS — F209 Schizophrenia, unspecified: Secondary | ICD-10-CM | POA: Diagnosis not present

## 2017-09-19 ENCOUNTER — Telehealth: Payer: Self-pay | Admitting: Family Medicine

## 2017-09-19 DIAGNOSIS — Z0110 Encounter for examination of ears and hearing without abnormal findings: Secondary | ICD-10-CM

## 2017-09-19 NOTE — Telephone Encounter (Signed)
Copied from CRM [phone]. Topic: Referral - Request >> Sep 19, 2017  2:21 PM Oneal GroutSebastian, Jennifer S wrote: Reason for CRM: Requesting referral to [facility] for hearing test, Dr. Enid DerryEmil Frymark

## 2017-09-20 NOTE — Telephone Encounter (Signed)
Referral entered and I called the pt and informed him of this.   

## 2017-09-20 NOTE — Telephone Encounter (Signed)
Ok to refer.

## 2017-10-04 ENCOUNTER — Encounter: Payer: Self-pay | Admitting: Family Medicine

## 2017-10-04 DIAGNOSIS — H903 Sensorineural hearing loss, bilateral: Secondary | ICD-10-CM | POA: Diagnosis not present

## 2017-10-04 DIAGNOSIS — H9312 Tinnitus, left ear: Secondary | ICD-10-CM | POA: Diagnosis not present

## 2017-10-29 DIAGNOSIS — F209 Schizophrenia, unspecified: Secondary | ICD-10-CM | POA: Diagnosis not present

## 2018-02-10 DIAGNOSIS — F209 Schizophrenia, unspecified: Secondary | ICD-10-CM | POA: Diagnosis not present

## 2018-02-14 ENCOUNTER — Telehealth: Payer: Self-pay | Admitting: *Deleted

## 2018-02-14 DIAGNOSIS — H9190 Unspecified hearing loss, unspecified ear: Secondary | ICD-10-CM

## 2018-02-14 NOTE — Telephone Encounter (Signed)
OK to refer.

## 2018-02-14 NOTE — Telephone Encounter (Signed)
Referral placed, faxed, and confirmed.

## 2018-02-14 NOTE — Telephone Encounter (Signed)
Last referral 09/2017 was to  Dr. Enid DerryEmil Frymark [facility] Address: [address], [place], [place] [address] Phone: [phone] Fax [phone]  Copied from CRM [phone]. Topic: Referral - Request >> Feb 13, 2018  3:59 PM Terisa Starraylor, Brittany L wrote: Reason for CRM: Patient is requesting a new referral to be sent to [facility] phone number [phone]. He said this would be his second time going to that office. They need a new referral

## 2018-05-29 DIAGNOSIS — F209 Schizophrenia, unspecified: Secondary | ICD-10-CM | POA: Diagnosis not present

## 2018-09-04 DIAGNOSIS — F209 Schizophrenia, unspecified: Secondary | ICD-10-CM | POA: Diagnosis not present

## 2018-09-10 ENCOUNTER — Encounter: Payer: Self-pay | Admitting: Family Medicine

## 2018-09-10 ENCOUNTER — Other Ambulatory Visit: Payer: Self-pay

## 2018-09-10 ENCOUNTER — Ambulatory Visit (INDEPENDENT_AMBULATORY_CARE_PROVIDER_SITE_OTHER): Payer: PPO | Admitting: Family Medicine

## 2018-09-10 VITALS — BP 104/64 | HR 55 | Temp 98.2°F | Wt 142.7 lb

## 2018-09-10 DIAGNOSIS — L03012 Cellulitis of left finger: Secondary | ICD-10-CM

## 2018-09-10 MED ORDER — TRIAMCINOLONE ACETONIDE 0.5 % EX CREA: 1.0000 "application " | TOPICAL_CREAM | Freq: Two times a day (BID) | CUTANEOUS | 1 refills | Status: DC

## 2018-09-10 NOTE — Progress Notes (Signed)
  Subjective:     Patient ID: [name], male   DOB: 12-09-1960, 58 y.o.   MRN: [id number]  HPI   Patient is seen with some chronic irritation left index finger.  Has had chronic paronychia in the past.  Had been on triamcinolone 0.1% cream and just started this back recently.  He works for [employer] and has his hands in water frequently.  He has very little pain or discomfort.  No redness.  No drainage.  No recent injury.  Past Medical History:  Diagnosis Date  . ADVEF, DRUG/MEDICINAL/BIOLOGICAL SUBST NOS 03/31/2007  . SCHIZOPHRENIA, PARANOID, UNSPECIFIED 03/31/2007  . Sebaceous cyst 01/24/2010   Past Surgical History:  Procedure Laterality Date  . ANKLE SURGERY     fracture    reports that he quit smoking about 19 years ago. His smoking use included cigarettes. He has a 5.00 pack-year smoking history. He has never used smokeless tobacco. He reports current drug use. Drug: Marijuana. He reports that he does not drink alcohol. family history includes Cancer in his mother; Cancer - Colon in his father. No Known Allergies'   Review of Systems  Constitutional: Negative for chills and fever.       Objective:   Physical Exam Constitutional:      Appearance: Normal appearance.  Cardiovascular:     Rate and Rhythm: Normal rate and regular rhythm.  Skin:    Comments: Left index finger reveals some edema diffusely involving the skin along the margin of the nail.  No erythema.  No purulence.  Nontender.  Neurological:     Mental Status: He is alert.        Assessment:     Chronic paronychia left index finger    Plan:     -He is instructed to keep hands out of water as much as possible.  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  Touch base if this is not improving over the next few weeks -Follow-up sooner for any erythema or other concerns  Kristian Covey MD [place]  [facility] at [facility]

## 2018-09-10 NOTE — Patient Instructions (Signed)
Paronychia Paronychia is an infection of the skin that surrounds a nail. It usually affects the skin around a fingernail, but it may also occur near a toenail. It often causes pain and swelling around the nail. In some cases, a collection of pus (abscess) can form near or under the nail.  This condition may develop suddenly, or it may develop gradually over a longer period. In most cases, paronychia is not serious, and it will clear up with treatment. What are the causes? This condition may be caused by bacteria or a fungus. These germs can enter the body through an opening in the skin, such as a cut or a hangnail. What increases the risk? This condition is more likely to develop in people who:  Get their hands wet often, such as those who work as dishwashers, bartenders, or nurses.  Bite their fingernails or suck their thumbs.  Trim their nails very short.  Have hangnails or injured fingertips.  Get manicures.  Have diabetes. What are the signs or symptoms? Symptoms of this condition include:  Redness and swelling of the skin near the nail.  Tenderness around the nail when you touch the area.  Pus-filled bumps under the skin at the base and sides of the nail (cuticle).  Fluid or pus under the nail.  Throbbing pain in the area. How is this diagnosed? This condition is diagnosed with a physical exam. In some cases, a sample of pus may be tested to determine what type of bacteria or fungus is causing the condition. How is this treated? Treatment depends on the cause and severity of your condition. If your condition is mild, it may clear up on its own in a few days or after soaking in warm water. If needed, treatment may include:  Antibiotic medicine, if your infection is caused by bacteria.  Antifungal medicine, if your infection is caused by a fungus.  A procedure to drain pus from an abscess.  Anti-inflammatory medicine (corticosteroids). Follow these instructions at  home: Wound care  Keep the affected area clean.  Soak the affected area in warm water, if told to do so by your health care provider. You may be told to do this for 20 minutes, 2-3 times a day.  Keep the area dry when you are not soaking it.  Do not try to drain an abscess yourself.  Follow instructions from your health care provider about how to take care of the affected area. Make sure you: ? Wash your hands with soap and water before you change your bandage (dressing). If soap and water are not available, use hand sanitizer. ? Change your dressing as told by your health care provider.  If you had an abscess drained, check the area every day for signs of infection. Check for: ? Redness, swelling, or pain. ? Fluid or blood. ? Warmth. ? Pus or a bad smell. Medicines   Take over-the-counter and prescription medicines only as told by your health care provider.  If you were prescribed an antibiotic medicine, take it as told by your health care provider. Do not stop taking the antibiotic even if you start to feel better. General instructions  Avoid contact with harsh chemicals.  Do not pick at the affected area. Prevention  To prevent this condition from happening again: ? Wear rubber gloves when washing dishes or doing other tasks that require your hands to get wet. ? Wear gloves if your hands might come in contact with cleaners or other chemicals. ? Avoid   injuring your nails or fingertips. ? Do not bite your nails or tear hangnails. ? Do not cut your nails very short. ? Do not cut your cuticles. ? Use clean nail clippers or scissors when trimming nails. Contact a health care provider if:  Your symptoms get worse or do not improve with treatment.  You have continued or increased fluid, blood, or pus coming from the affected area.  Your finger or knuckle becomes swollen or difficult to move. Get help right away if you have:  A fever or chills.  Redness spreading away  from the affected area.  Joint or muscle pain. Summary  Paronychia is an infection of the skin that surrounds a nail. It often causes pain and swelling around the nail. In some cases, a collection of pus (abscess) can form near or under the nail.  This condition may be caused by bacteria or a fungus. These germs can enter the body through an opening in the skin, such as a cut or a hangnail.  If your condition is mild, it may clear up on its own in a few days. If needed, treatment may include medicine or a procedure to drain pus from an abscess.  To prevent this condition from happening again, wear gloves if doing tasks that require your hands to get wet or to come in contact with chemicals. Also avoid injuring your nails or fingertips. This information is not intended to replace advice given to you by your health care provider. Make sure you discuss any questions you have with your health care provider. Document Released: 01/30/2001 Document Revised: 08/19/2017 Document Reviewed: 08/19/2017 Elsevier Interactive Patient Education  2019 [employer].  Try to keep hands out of water as much as possible  Use gloves to keep hands dry with dishwashing  Use the medicated cream twice daily.

## 2018-11-05 ENCOUNTER — Emergency Department (HOSPITAL_COMMUNITY): Payer: PPO

## 2018-11-05 ENCOUNTER — Other Ambulatory Visit: Payer: Self-pay

## 2018-11-05 ENCOUNTER — Encounter (HOSPITAL_COMMUNITY): Payer: Self-pay

## 2018-11-05 ENCOUNTER — Emergency Department (HOSPITAL_COMMUNITY)
Admission: EM | Admit: 2018-11-05 | Discharge: 2018-11-05 | Disposition: A | Discharge status: Home / Self Care | Payer: PPO | Attending: Emergency Medicine | Admitting: Emergency Medicine

## 2018-11-05 DIAGNOSIS — S68119A Complete traumatic metacarpophalangeal amputation of unspecified finger, initial encounter: Secondary | ICD-10-CM

## 2018-11-05 DIAGNOSIS — S61213A Laceration without foreign body of left middle finger without damage to nail, initial encounter: Secondary | ICD-10-CM | POA: Diagnosis not present

## 2018-11-05 DIAGNOSIS — S68623A Partial traumatic transphalangeal amputation of left middle finger, initial encounter: Secondary | ICD-10-CM | POA: Diagnosis not present

## 2018-11-05 DIAGNOSIS — Y929 Unspecified place or not applicable: Secondary | ICD-10-CM | POA: Insufficient documentation

## 2018-11-05 DIAGNOSIS — S68125A Partial traumatic metacarpophalangeal amputation of left ring finger, initial encounter: Secondary | ICD-10-CM | POA: Diagnosis not present

## 2018-11-05 DIAGNOSIS — Z87891 Personal history of nicotine dependence: Secondary | ICD-10-CM | POA: Diagnosis not present

## 2018-11-05 DIAGNOSIS — Z23 Encounter for immunization: Secondary | ICD-10-CM | POA: Insufficient documentation

## 2018-11-05 DIAGNOSIS — W268XXA Contact with other sharp object(s), not elsewhere classified, initial encounter: Secondary | ICD-10-CM | POA: Insufficient documentation

## 2018-11-05 DIAGNOSIS — T1490XA Injury, unspecified, initial encounter: Secondary | ICD-10-CM

## 2018-11-05 DIAGNOSIS — Z79899 Other long term (current) drug therapy: Secondary | ICD-10-CM | POA: Diagnosis not present

## 2018-11-05 DIAGNOSIS — Y999 Unspecified external cause status: Secondary | ICD-10-CM | POA: Diagnosis not present

## 2018-11-05 DIAGNOSIS — Y93H2 Activity, gardening and landscaping: Secondary | ICD-10-CM | POA: Insufficient documentation

## 2018-11-05 DIAGNOSIS — S68129A Partial traumatic metacarpophalangeal amputation of unspecified finger, initial encounter: Secondary | ICD-10-CM

## 2018-11-05 MED ORDER — LIDOCAINE-EPINEPHRINE (PF) 2 %-1:200000 IJ SOLN
20.0000 mL | Freq: Once | INTRAMUSCULAR | Status: AC
  Administered 2018-11-05: 20 mL
  Filled 2018-11-05: qty 20

## 2018-11-05 MED ORDER — CEPHALEXIN 500 MG PO CAPS: 500.0000 mg | ORAL_CAPSULE | Freq: Four times a day (QID) | ORAL | 0 refills | Status: DC

## 2018-11-05 MED ORDER — TETANUS-DIPHTH-ACELL PERTUSSIS 5-2.5-18.5 LF-MCG/0.5 IM SUSP
0.5000 mL | Freq: Once | INTRAMUSCULAR | Status: AC
  Administered 2018-11-05: 0.5 mL via INTRAMUSCULAR
  Filled 2018-11-05: qty 0.5

## 2018-11-05 NOTE — ED Triage Notes (Signed)
Patient cut the tip of the left pointer finger off, and pad of the right middle finger off with a lawnmower blade. Bleeding minimal.

## 2018-11-05 NOTE — ED Provider Notes (Signed)
[facility]-EMERGENCY DEPT Provider Note   CSN: [id number] Arrival date & time: 11/05/18  1628    History   Chief Complaint Chief Complaint  Patient presents with   finger laceration    HPI Nakeem Trzcinski is a 58 y.o. male.     58 yo M with a chief complaint of 2 finger lacerations on a lawnmower blade.  This happened earlier today.  He thinks his tetanus may be up-to-date.  Denies other injury.  Had some bleeding to the area.  Describes it as a burning pain. Happened to the left and right third digit.   The history is provided by the patient.  Injury  This is a new problem. The current episode started 1 to 2 hours ago. The problem occurs constantly. The problem has not changed since onset.Pertinent negatives include no chest pain, no abdominal pain, no headaches and no shortness of breath. Nothing aggravates the symptoms. Nothing relieves the symptoms. He has tried nothing for the symptoms. The treatment provided no relief.    Past Medical History:  Diagnosis Date   ADVEF, DRUG/MEDICINAL/BIOLOGICAL SUBST NOS 03/31/2007   SCHIZOPHRENIA, PARANOID, UNSPECIFIED 03/31/2007   Sebaceous cyst 01/24/2010    Patient Active Problem List   Diagnosis Date Noted   Schizophrenia, paranoid type (HCC) 02/04/2012   SEBACEOUS CYST 01/24/2010   HYPERLIPIDEMIA NEC/NOS 04/28/2007   SCHIZOPHRENIA, PARANOID, UNSPECIFIED 03/31/2007   ADVEF, DRUG/MEDICINAL/BIOLOGICAL SUBST NOS 03/31/2007    Past Surgical History:  Procedure Laterality Date   ANKLE SURGERY     fracture        Home Medications    Prior to Admission medications   Medication Sig Start Date End Date Taking? Authorizing Provider  cephALEXin (KEFLEX) 500 MG capsule Take 1 capsule (500 mg total) by mouth 4 (four) times daily. 11/05/18   Melene Plan, DO  FLUoxetine (PROZAC) 20 MG tablet Take 10 mg by mouth daily.     [provider]  Garlic 500 MG TABS Take 1 tablet by mouth daily.     [provider]  Multiple Vitamin (MULTIVITAMIN WITH MINERALS) TABS tablet Take 1 tablet by mouth daily.    [provider]  risperiDONE (RISPERDAL) 3 MG tablet  09/09/18   [provider]  triamcinolone cream (KENALOG) 0.5 % Apply 1 application topically 2 (two) times daily. 09/10/18   Burchette, Elberta Fortis, MD  ARIPiprazole (ABILIFY) 20 MG tablet Take 20 mg by mouth daily. Per [facility]   10/29/11  [provider]    Family History Family History  Problem Relation Age of Onset   Cancer Mother        stomach   Cancer - Colon Father     Social History Social History   Tobacco Use   Smoking status: Former Smoker    Packs/day: 0.50    Years: 10.00    Pack years: 5.00    Types: Cigarettes    Last attempt to quit: 01/08/1999    Years since quitting: 19.8   Smokeless tobacco: Never Used  Substance Use Topics   Alcohol use: No   Drug use: Not Currently    Types: Marijuana    Comment: former user of marijuana     Allergies   Patient has no known allergies.   Review of Systems Review of Systems  Constitutional: Negative for chills and fever.  HENT: Negative for congestion and facial swelling.   Eyes: Negative for discharge and visual disturbance.  Respiratory: Negative for shortness of breath.  Cardiovascular: Negative for chest pain and palpitations.  Gastrointestinal: Negative for abdominal pain, diarrhea and vomiting.  Musculoskeletal: Negative for arthralgias and myalgias.  Skin: Positive for wound. Negative for color change and rash.  Neurological: Negative for tremors, syncope and headaches.  Psychiatric/Behavioral: Negative for confusion and dysphoric mood.     Physical Exam Updated Vital Signs BP (!) 132/101 (BP Location: Right Arm)    Pulse 63    Temp 98.9 F (37.2 C) (Oral)    Resp 18    Ht 5\' 10"  (1.778 m)    Wt 63.5 kg    SpO2 100%    BMI 20.09 kg/m   Physical Exam Vitals signs and nursing note reviewed.    Constitutional:      Appearance: He is well-developed.  HENT:     Head: Normocephalic and atraumatic.  Eyes:     Pupils: Pupils are equal, round, and reactive to light.  Neck:     Musculoskeletal: Normal range of motion and neck supple.     Vascular: No JVD.  Cardiovascular:     Rate and Rhythm: Normal rate and regular rhythm.     Heart sounds: No murmur. No friction rub. No gallop.   Pulmonary:     Effort: No respiratory distress.     Breath sounds: No wheezing.  Abdominal:     General: There is no distension.     Tenderness: There is no guarding or rebound.  Musculoskeletal: Normal range of motion.     Comments: Avulsion of the pad of the right third digit.  Partial amputation of the distal aspect of the left third digit.  Skin:    Coloration: Skin is not pale.     Findings: No rash.  Neurological:     Mental Status: He is alert and oriented to person, place, and time.  Psychiatric:        Behavior: Behavior normal.      ED Treatments / Results  Labs (all labs ordered are listed, but only abnormal results are displayed) Labs Reviewed - No data to display  EKG None  Radiology Dg Finger Middle Left  Result Date: 11/05/2018 CLINICAL DATA:  Finger injury EXAM: LEFT MIDDLE FINGER 2+V COMPARISON:  None. FINDINGS: Soft tissue amputation distal third digit. Underlying comminuted fracture involving the tuft of the distal phalanx. No subluxation or radiopaque foreign body IMPRESSION: Soft tissue and bony amputation of the distal fourth digit with comminuted tuft fracture of the distal phalanx Electronically Signed   By: Jasmine Pang M.D.   On: 11/05/2018 18:00    Procedures .Marland KitchenLaceration Repair Date/Time: 11/05/2018 6:42 PM Performed by: Melene Plan, DO Authorized by: Melene Plan, DO   Consent:    Consent obtained:  Verbal   Consent given by:  Patient   Risks discussed:  Infection, pain, need for additional repair, poor cosmetic result, poor wound healing and nerve  damage   Alternatives discussed:  No treatment Anesthesia (see MAR for exact dosages):    Anesthesia method:  Nerve block   Block location:  Digital   Block needle gauge:  27 G   Block anesthetic:  Lidocaine 2% w/o epi   Block injection procedure:  Anatomic landmarks identified and anatomic landmarks palpated   Block outcome:  Anesthesia achieved Laceration details:    Location:  Finger   Finger location:  L long finger   Length (cm):  8 Repair type:    Repair type:  Complex Exploration:    Limited defect created (wound extended): yes  Hemostasis achieved with:  Direct pressure and epinephrine   Wound exploration: wound explored through full range of motion and entire depth of wound probed and visualized     Wound extent: muscle damage     Contaminated: no   Treatment:    Area cleansed with:  Soap and water   Amount of cleaning:  Extensive   Irrigation solution:  Tap water   Irrigation volume:  5 min of flushing   Irrigation method:  Tap   Visualized foreign bodies/material removed: no     Debridement:  Moderate   Undermining:  None   Scar revision: no   Skin repair:    Repair method:  Sutures   Suture size:  4-0   Suture material:  Nylon   Suture technique:  Simple interrupted   Number of sutures:  8 Approximation:    Approximation:  Close Post-procedure details:    Dressing:  Open (no dressing)   Patient tolerance of procedure:  Tolerated well, no immediate complications   (including critical care time)  Medications Ordered in ED Medications  Tdap (BOOSTRIX) injection 0.5 mL (has no administration in time range)  lidocaine-EPINEPHrine (XYLOCAINE W/EPI) 2 %-1:200000 (PF) injection 20 mL (20 mLs Infiltration Given by Other 11/05/18 1802)     Initial Impression / Assessment and Plan / ED Course  I have reviewed the triage vital signs and the nursing notes.  Pertinent labs & imaging results that were available during my care of the patient were reviewed by me  and considered in my medical decision making (see chart for details).        58 yo M with a chief complaints of a laceration to 2 different fingers.  He has an avulsion of the skin to the pad of the right third digit which I think will have to heal by second intention. L third digit with possible exposed bone, will perform digital block, plain film.   Plain film with distal tuft fracture.  Discussed options with patient, electing not to be sent to hand surgery, cleaned and repaired at bedside.  PCP follow up.   6:45 PM:  I have discussed the diagnosis/risks/treatment options with the patient and family and believe the pt to be eligible for discharge home to follow-up with PCP. We also discussed returning to the ED immediately if new or worsening sx occur. We discussed the sx which are most concerning (e.g., sudden worsening pain, fever, inability to tolerate by mouth) that necessitate immediate return. Medications administered to the patient during their visit and any new prescriptions provided to the patient are listed below.  Medications given during this visit Medications  Tdap (BOOSTRIX) injection 0.5 mL (has no administration in time range)  lidocaine-EPINEPHrine (XYLOCAINE W/EPI) 2 %-1:200000 (PF) injection 20 mL (20 mLs Infiltration Given by Other 11/05/18 1802)     The patient appears reasonably screen and/or stabilized for discharge and I doubt any other medical condition or other [facility] requiring further screening, evaluation, or treatment in the ED at this time prior to discharge.    Final Clinical Impressions(s) / ED Diagnoses   Final diagnoses:  Fingertip amputation, initial encounter    ED Discharge Orders         Ordered    cephALEXin (KEFLEX) 500 MG capsule  4 times daily     11/05/18 1839           Melene PlanFloyd, Hayla Hinger, DO 11/05/18 1845

## 2018-11-05 NOTE — ED Notes (Signed)
Wrapped left and right fingers with non-adherent 3x3, stretch bandage roll, and tape.

## 2018-11-05 NOTE — Discharge Instructions (Addendum)
Return for redness drainage, fever.  Do not fully immerse the area(no swimming, scuba diving). No scrubing the area.  It can get wet.  Take the antibiotics to try and prevent infection.  Try to keep the area clean if you have to work or exercise with your hands try to cover the area.

## 2018-12-04 DIAGNOSIS — F209 Schizophrenia, unspecified: Secondary | ICD-10-CM | POA: Diagnosis not present

## 2019-01-06 ENCOUNTER — Other Ambulatory Visit: Payer: Self-pay

## 2019-01-06 ENCOUNTER — Ambulatory Visit (INDEPENDENT_AMBULATORY_CARE_PROVIDER_SITE_OTHER): Payer: PPO | Admitting: Family Medicine

## 2019-01-06 DIAGNOSIS — R972 Elevated prostate specific antigen [PSA]: Secondary | ICD-10-CM

## 2019-01-06 NOTE — Progress Notes (Signed)
Patient ID: [name], male   DOB: 04/25/1961, 58 y.o.   MRN: [id number]  This visit type was conducted due to national recommendations for restrictions regarding the COVID-19 pandemic in an effort to limit this patient's exposure and mitigate transmission in our community.   Virtual Visit via Telephone Note  I connected with [name] on 01/06/19 at  4:30 PM EDT by telephone and verified that I am speaking with the correct person using two identifiers.   I discussed the limitations, risks, security and privacy concerns of performing an evaluation and management service by telephone and the availability of in person appointments. I also discussed with the patient that there may be a patient responsible charge related to this service. The patient expressed understanding and agreed to proceed.  Location patient: home Location provider: work or home office Participants present for the call: patient, provider Patient did not have a visit in the prior 7 days to address this/these issue(s).   History of Present Illness: Patient called requesting "follow-up lab work.".  He apparently was requesting to get follow-up physical.  He has history of paranoid schizophrenia followed by psychiatry.  He takes fluoxetine and Risperdal.  No other medications.  No history of diabetes.  In looking back he had last set of labs done October 2018.  PSA was up to 3.16 at that time compared with prior value of 1.91.  Denies any obstructive urinary symptoms.  Will be due for repeat colonoscopy soon.  He had injury of right third finger with partial amputation secondary to lawnmower blade recently.  That is healing well without complication.   Observations/Objective: Patient sounds cheerful and well on the phone. I do not appreciate any SOB. Speech and thought processing are grossly intact. Patient reported vitals:  Assessment and Plan:  History of increased PSA velocity as above  -Set up physical  exam. -We will plan to get labs including follow-up PSA.  We will try to get set up for this Friday  Follow Up Instructions:  -Set up complete physical as above   [id number] [id number] [id number] 11-20 [id number] 21-30 I did not refer this patient for an OV in the next 24 hours for this/these issue(s).  I discussed the assessment and treatment plan with the patient. The patient was provided an opportunity to ask questions and all were answered. The patient agreed with the plan and demonstrated an understanding of the instructions.   The patient was advised to call back or seek an in-person evaluation if the symptoms worsen or if the condition fails to improve as anticipated.  I provided 11 minutes of non-face-to-face time during this encounter.   Jonathan Peat, MD

## 2019-01-06 NOTE — Progress Notes (Signed)
Patient ID: [name], male   DOB: Feb 07, 1961, 58 y.o.   MRN: [id number]

## 2019-01-23 ENCOUNTER — Encounter: Payer: Self-pay | Admitting: Family Medicine

## 2019-01-23 ENCOUNTER — Ambulatory Visit (INDEPENDENT_AMBULATORY_CARE_PROVIDER_SITE_OTHER): Payer: PPO | Admitting: Family Medicine

## 2019-01-23 ENCOUNTER — Other Ambulatory Visit: Payer: Self-pay

## 2019-01-23 VITALS — BP 122/60 | HR 82 | Temp 98.3°F | Wt 140.8 lb

## 2019-01-23 DIAGNOSIS — Z0000 Encounter for general adult medical examination without abnormal findings: Secondary | ICD-10-CM | POA: Diagnosis not present

## 2019-01-23 LAB — BASIC METABOLIC PANEL
BUN: 10 mg/dL (ref 6–23)
CO2: 28 mEq/L (ref 19–32)
Calcium: 9.2 mg/dL (ref 8.4–10.5)
Chloride: 103 mEq/L (ref 96–112)
Creatinine, Ser: 0.97 mg/dL (ref 0.40–1.50)
GFR: 96.49 mL/min (ref >60.00)
Glucose, Bld: 92 mg/dL (ref 70–99)
Potassium: 4.2 mEq/L (ref 3.5–5.1)
Sodium: 139 mEq/L (ref 135–145)

## 2019-01-23 LAB — CBC WITH DIFFERENTIAL/PLATELET
Basophils Absolute: 0 10*3/uL (ref 0.0–0.1)
Basophils Relative: 0.6 % (ref 0.0–3.0)
Eosinophils Absolute: 0.1 10*3/uL (ref 0.0–0.7)
Eosinophils Relative: 1.1 % (ref 0.0–5.0)
HCT: 41.6 % (ref 39.0–52.0)
Hemoglobin: 14.2 g/dL (ref 13.0–17.0)
Lymphocytes Relative: 22 % (ref 12.0–46.0)
Lymphs Abs: 1.5 10*3/uL (ref 0.7–4.0)
MCHC: 34.2 g/dL (ref 30.0–36.0)
MCV: 78 fl (ref 78.0–100.0)
Monocytes Absolute: 0.5 10*3/uL (ref 0.1–1.0)
Monocytes Relative: 6.9 % (ref 3.0–12.0)
Neutro Abs: 4.8 10*3/uL (ref 1.4–7.7)
Neutrophils Relative %: 69.4 % (ref 43.0–77.0)
Platelets: 228 10*3/uL (ref 150.0–400.0)
RBC: 5.33 Mil/uL (ref 4.22–5.81)
RDW: 13.3 % (ref 11.5–15.5)
WBC: 7 10*3/uL (ref 4.0–10.5)

## 2019-01-23 LAB — LIPID PANEL
Cholesterol: 168 mg/dL (ref 0–200)
HDL: 80 mg/dL (ref >39.00)
LDL Cholesterol: 82 mg/dL (ref 0–99)
NonHDL: 88.46
Total CHOL/HDL Ratio: 2
Triglycerides: 33 mg/dL (ref 0.0–149.0)
VLDL: 6.6 mg/dL (ref 0.0–40.0)

## 2019-01-23 LAB — HEPATIC FUNCTION PANEL
ALT: 11 U/L (ref 0–53)
AST: 16 U/L (ref 0–37)
Albumin: 4.4 g/dL (ref 3.5–5.2)
Alkaline Phosphatase: 69 U/L (ref 39–117)
Bilirubin, Direct: 0.2 mg/dL (ref 0.0–0.3)
Total Bilirubin: 0.8 mg/dL (ref 0.2–1.2)
Total Protein: 6.3 g/dL (ref 6.0–8.3)

## 2019-01-23 LAB — PSA: PSA: 4.43 ng/mL — ABNORMAL HIGH (ref 0.10–4.00)

## 2019-01-23 LAB — TSH: TSH: 0.58 u[IU]/mL (ref 0.35–4.50)

## 2019-01-23 NOTE — Patient Instructions (Signed)
Consider shingles vaccine (Shingrix) and let us know if interested.  

## 2019-01-23 NOTE — Progress Notes (Signed)
Subjective:     Patient ID: [name], male   DOB: January 30, 1961, 58 y.o.   MRN: [id number]  HPI Patient here for physical exam.  He has history of schizophrenia which is followed by psychiatry.  He is overall doing well.  He enjoys gardening and has started a fairly large garden.  He is currently taking fluoxetine and Risperdal.  We discussed several health maintenance issues as below  -No history of shingles vaccine -Had tetanus recently on 11/05/2018 -Colonoscopy 06/12/2010 with recommended 10-year follow-up -Hepatitis C screening 2018-  Mother had gastric cancer history.  No family history of prostate cancer or known colon cancer.  Past Medical History:  Diagnosis Date  . ADVEF, DRUG/MEDICINAL/BIOLOGICAL SUBST NOS 03/31/2007  . SCHIZOPHRENIA, PARANOID, UNSPECIFIED 03/31/2007  . Sebaceous cyst 01/24/2010   Past Surgical History:  Procedure Laterality Date  . ANKLE SURGERY     fracture    reports that he quit smoking about 20 years ago. His smoking use included cigarettes. He has a 5.00 pack-year smoking history. He has never used smokeless tobacco. He reports previous drug use. Drug: Marijuana. He reports that he does not drink alcohol. family history includes Cancer in his mother; Cancer - Colon in his father. No Known Allergies   Review of Systems  Constitutional: Negative for activity change, appetite change, fatigue and fever.  HENT: Negative for congestion, ear pain and trouble swallowing.   Eyes: Negative for pain and visual disturbance.  Respiratory: Negative for cough, shortness of breath and wheezing.   Cardiovascular: Negative for chest pain and palpitations.  Gastrointestinal: Negative for abdominal distention, abdominal pain, blood in stool, constipation, diarrhea, nausea, rectal pain and vomiting.  Endocrine: Negative for polydipsia and polyuria.  Genitourinary: Negative for dysuria, hematuria and testicular pain.  Musculoskeletal: Negative for arthralgias and  joint swelling.  Skin: Negative for rash.  Neurological: Negative for dizziness, syncope and headaches.  Hematological: Negative for adenopathy.  Psychiatric/Behavioral: Negative for confusion and dysphoric mood.       Objective:   Physical Exam Constitutional:      General: He is not in acute distress.    Appearance: He is well-developed.  HENT:     Head: Normocephalic and atraumatic.     Right Ear: External ear normal.     Left Ear: External ear normal.  Eyes:     Conjunctiva/sclera: Conjunctivae normal.     Pupils: Pupils are equal, round, and reactive to light.  Neck:     Musculoskeletal: Normal range of motion and neck supple.     Thyroid: No thyromegaly.  Cardiovascular:     Rate and Rhythm: Normal rate and regular rhythm.     Heart sounds: Normal heart sounds. No murmur.  Pulmonary:     Effort: No respiratory distress.     Breath sounds: No wheezing or rales.  Abdominal:     General: Bowel sounds are normal. There is no distension.     Palpations: Abdomen is soft. There is no mass.     Tenderness: There is no abdominal tenderness. There is no guarding or rebound.  Lymphadenopathy:     Cervical: No cervical adenopathy.  Skin:    Findings: No rash.  Neurological:     Mental Status: He is alert and oriented to person, place, and time.     Cranial Nerves: No cranial nerve deficit.     Deep Tendon Reflexes: Reflexes normal.        Assessment:     Physical exam.  Patient has history  of schizophrenia which is followed by psychiatry.  Overall stable.  We discussed the following health maintenance issues    Plan:     -Check screening labs.  Include PSA. -Recommend continued yearly flu vaccine -Recommend he consider Shingrix.  He declines at this time but will consider. -We will need repeat colonoscopy by next year  Kristian CoveyBruce W Jahzier Villalon MD [facility]

## 2019-01-27 ENCOUNTER — Other Ambulatory Visit: Payer: Self-pay | Admitting: Family Medicine

## 2019-01-27 DIAGNOSIS — R972 Elevated prostate specific antigen [PSA]: Secondary | ICD-10-CM

## 2019-03-26 DIAGNOSIS — F209 Schizophrenia, unspecified: Secondary | ICD-10-CM | POA: Diagnosis not present

## 2019-06-03 IMAGING — CR LEFT MIDDLE FINGER 2+V
3 series · 3 of 3 positions shown · non-contrast
Comparison: None.

CLINICAL DATA: Finger injury

EXAM:
LEFT MIDDLE FINGER 2+V

[x finger pa left]
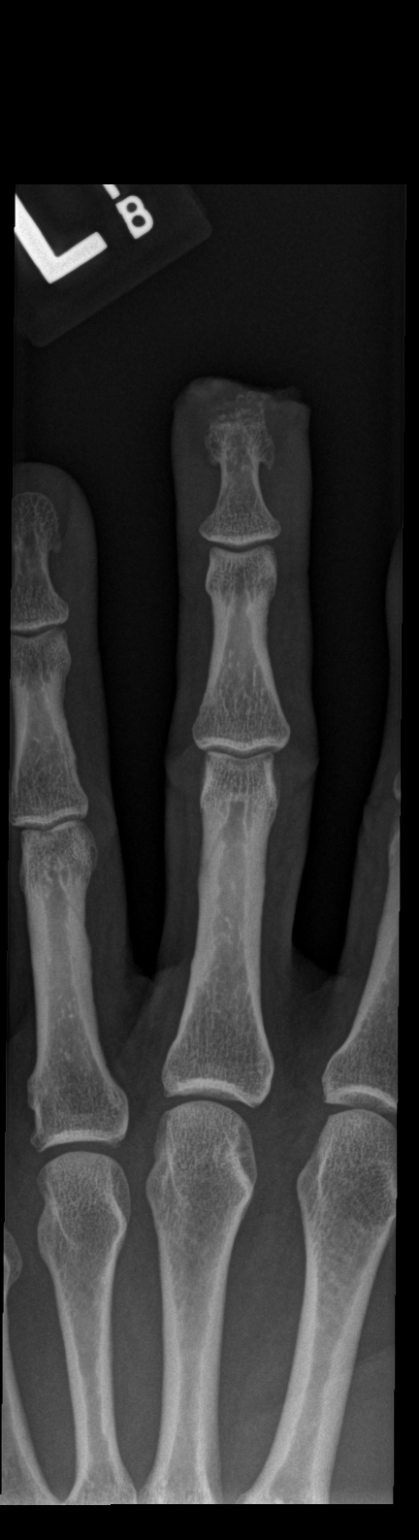

[x finger obl left]
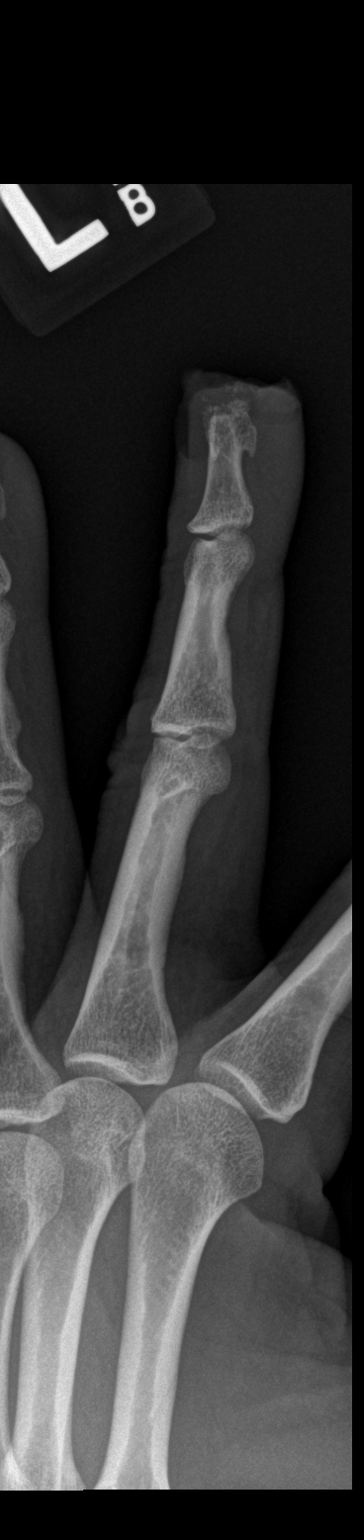

[x finger lat left]
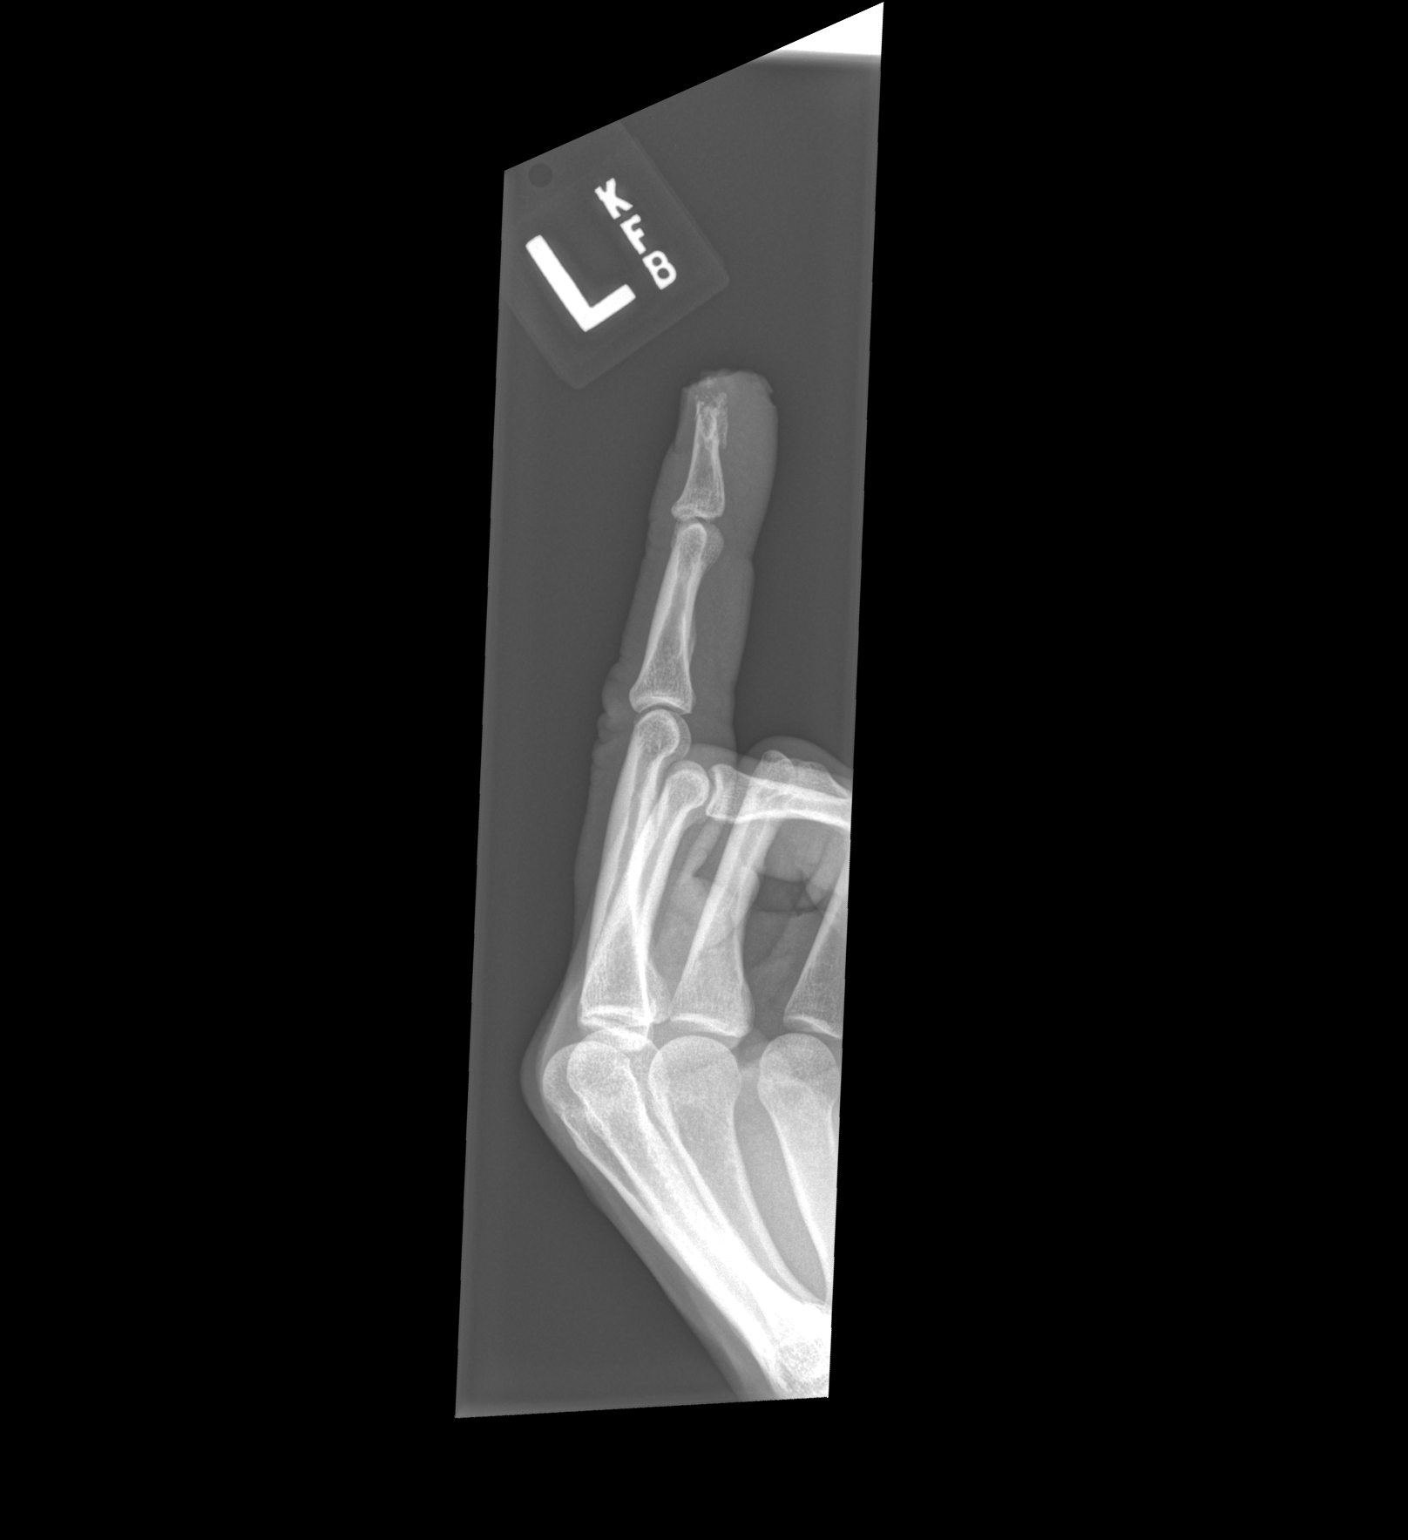

[3 of 3 positions shown; findings below may reference images not displayed]

FINDINGS: Soft tissue amputation distal third digit. Underlying comminuted
fracture involving the tuft of the distal phalanx. No subluxation or
radiopaque foreign body
IMPRESSION: Soft tissue and bony amputation of the distal fourth digit with
comminuted tuft fracture of the distal phalanx

## 2019-06-17 DIAGNOSIS — F411 Generalized anxiety disorder: Secondary | ICD-10-CM | POA: Diagnosis not present

## 2019-06-17 DIAGNOSIS — F209 Schizophrenia, unspecified: Secondary | ICD-10-CM | POA: Diagnosis not present

## 2019-09-01 DIAGNOSIS — F209 Schizophrenia, unspecified: Secondary | ICD-10-CM | POA: Diagnosis not present

## 2019-11-24 DIAGNOSIS — F209 Schizophrenia, unspecified: Secondary | ICD-10-CM | POA: Diagnosis not present

## 2019-12-07 ENCOUNTER — Ambulatory Visit (INDEPENDENT_AMBULATORY_CARE_PROVIDER_SITE_OTHER): Payer: PPO | Admitting: Family Medicine

## 2019-12-07 ENCOUNTER — Other Ambulatory Visit: Payer: Self-pay

## 2019-12-07 ENCOUNTER — Encounter: Payer: Self-pay | Admitting: Family Medicine

## 2019-12-07 VITALS — BP 122/68 | HR 67 | Temp 97.7°F | Wt 147.5 lb

## 2019-12-07 DIAGNOSIS — L03012 Cellulitis of left finger: Secondary | ICD-10-CM

## 2019-12-07 DIAGNOSIS — K5900 Constipation, unspecified: Secondary | ICD-10-CM | POA: Diagnosis not present

## 2019-12-07 MED ORDER — TRIAMCINOLONE ACETONIDE 0.5 % EX CREA: 1.0000 "application " | TOPICAL_CREAM | Freq: Two times a day (BID) | CUTANEOUS | 1 refills | Status: AC

## 2019-12-07 NOTE — Patient Instructions (Signed)

## 2019-12-07 NOTE — Progress Notes (Signed)
Subjective:     Patient ID: [name], male   DOB: [birth year], 59 y.o.   MRN: [id number]  HPI Patient is seen today for the following issues  He relates some recent constipation issues off and on for months.  He states he was eating a regimen of tunafish and [employer] peanut butter cups frequently at night and he thought this may have had something to do with the constipation.  However, he is also taking some fiber with psyllium husk and taken some stool softeners with some initial improvement.  After they quit working he started some Ex-Lax but was instructed not to take this regularly.  He denies any bloody stools.  Appetite and weight stable.  Last colonoscopy was 10 years ago.  No polyps were found then.  Denies any abdominal bloating.  No nausea or vomiting. He would like to get scheduled for repeat colonoscopy at this time  No new medications.  He takes Risperdal but has been on this for several years.  He has inflammation left index finger.  This has been somewhat of a chronic problem.  He had previously taken steroid prescription cream which helped.  He does frequently have his hands in water.  He has some soreness involving the cuticle around the base of the nail.  No purulent drainage.  Past Medical History:  Diagnosis Date  . ADVEF, DRUG/MEDICINAL/BIOLOGICAL SUBST NOS 03/31/2007  . SCHIZOPHRENIA, PARANOID, UNSPECIFIED 03/31/2007  . Sebaceous cyst 01/24/2010   Past Surgical History:  Procedure Laterality Date  . ANKLE SURGERY     fracture    reports that he quit smoking about 20 years ago. His smoking use included cigarettes. He has a 5.00 pack-year smoking history. He has never used smokeless tobacco. He reports previous drug use. Drug: Marijuana. He reports that he does not drink alcohol. family history includes Cancer in his mother; Cancer - Colon in his father. No Known Allergies   Review of Systems  Constitutional: Negative for appetite change, chills, fever and  unexpected weight change.  Respiratory: Negative for shortness of breath.   Cardiovascular: Positive for chest pain.  Gastrointestinal: Positive for constipation. Negative for abdominal pain, nausea and vomiting.       Objective:   Physical Exam Vitals reviewed.  Constitutional:      Appearance: Normal appearance.  Cardiovascular:     Rate and Rhythm: Normal rate and regular rhythm.  Pulmonary:     Effort: Pulmonary effort is normal.     Breath sounds: Normal breath sounds.  Abdominal:     General: Bowel sounds are normal. There is no distension.     Palpations: Abdomen is soft. There is no mass.     Tenderness: There is no abdominal tenderness. There is no guarding or rebound.  Skin:    Comments: He has some soft tissue inflammation left index finger involving the cuticle.  There is no significant erythema.  Mildly tender.  No purulent drainage.  Neurological:     Mental Status: He is alert.        Assessment:     #1 constipation.  Has been battling this some for the past few months.  No obvious changes such as new medication.  He is in no distress at this time.  Does take Risperdal but has been on this for several years  #2 chronic paronychia left index finger    Plan:     -Discussed basic measures such as adequate fluids, increase fiber, continued use of stool softeners as needed.  Recommend avoidance of regular stimulant laxatives.  Recommend MiraLAX as needed for any ongoing constipation issues.  Increase fiber to at least 30 g daily and consider supplement if needed  -Refilled Kenalog 0.05% cream to use once or twice daily to left index finger and keep finger dry-and out of water- as much as possible  Kristian Covey MD [facility] at [facility]

## 2020-02-09 DIAGNOSIS — F209 Schizophrenia, unspecified: Secondary | ICD-10-CM | POA: Diagnosis not present

## 2020-04-27 DIAGNOSIS — F209 Schizophrenia, unspecified: Secondary | ICD-10-CM | POA: Diagnosis not present

## 2020-06-21 ENCOUNTER — Other Ambulatory Visit: Payer: Self-pay

## 2020-06-21 ENCOUNTER — Ambulatory Visit (INDEPENDENT_AMBULATORY_CARE_PROVIDER_SITE_OTHER): Payer: PPO

## 2020-06-21 DIAGNOSIS — Z0000 Encounter for general adult medical examination without abnormal findings: Secondary | ICD-10-CM

## 2020-06-21 NOTE — Progress Notes (Signed)
Virtual Visit via Telephone Note  I connected with  [name] on 06/21/20 at  2:30 PM EDT by telephone and verified that I am speaking with the correct person using two identifiers.  Medicare Annual Wellness visit completed telephonically due to Covid-19 pandemic.   Persons participating in this call: This Health Coach and this patient.   Location: Patient: Home Provider: Office   I discussed the limitations, risks, security and privacy concerns of performing an evaluation and management service by telephone and the availability of in person appointments. The patient expressed understanding and agreed to proceed.  Unable to perform video visit due to video visit attempted and failed and/or patient does not have video capability.   Some vital signs may be absent or patient reported.   Marzella Schlein, LPN    Subjective:   [name] is a 59 y.o. male who presents for an Initial Medicare Annual Wellness Visit.  Review of Systems    Cardiac Risk Factors include: male gender;dyslipidemia     Objective:    There were no vitals filed for this visit. There is no height or weight on file to calculate BMI.  Advanced Directives 06/21/2020 11/05/2018 05/28/2017 01/31/2012  Does Patient Have a Medical Advance Directive? No No No Patient does not have advance directive  Would patient like information on creating a medical advance directive? Yes (MAU/Ambulatory/Procedural Areas - Information given) No - Patient declined Yes (MAU/Ambulatory/Procedural Areas - Information given) -    Current Medications (verified) Outpatient Encounter Medications as of 06/21/2020  Medication Sig  . busPIRone (BUSPAR) 10 MG tablet Take 10 mg by mouth 3 (three) times daily.  Marland Kitchen doxepin (SINEQUAN) 10 MG capsule   . Garlic 500 MG TABS Take 1 tablet by mouth daily.  . Multiple Vitamin (MULTIVITAMIN WITH MINERALS) TABS tablet Take 1 tablet by mouth daily.  . risperiDONE (RISPERDAL) 3 MG tablet   .  triamcinolone cream (KENALOG) 0.5 % Apply 1 application topically 2 (two) times daily.  . [DISCONTINUED] ARIPiprazole (ABILIFY) 20 MG tablet Take 20 mg by mouth daily. Per [facility]   . [DISCONTINUED] cephALEXin (KEFLEX) 500 MG capsule Take 1 capsule (500 mg total) by mouth 4 (four) times daily. (Patient not taking: Reported on 12/07/2019)  . [DISCONTINUED] FLUoxetine (PROZAC) 20 MG tablet Take 10 mg by mouth daily.  (Patient not taking: Reported on 06/21/2020)   No facility-administered encounter medications on file as of 06/21/2020.    Allergies (verified) Seasonal ic [cholestatin]   History: Past Medical History:  Diagnosis Date  . ADVEF, DRUG/MEDICINAL/BIOLOGICAL SUBST NOS 03/31/2007  . SCHIZOPHRENIA, PARANOID, UNSPECIFIED 03/31/2007  . Sebaceous cyst 01/24/2010   Past Surgical History:  Procedure Laterality Date  . ANKLE SURGERY     fracture   Family History  Problem Relation Age of Onset  . Cancer Mother        stomach  . Cancer - Colon Father    Social History   Socioeconomic History  . Marital status: Single    Spouse name: Not on file  . Number of children: Not on file  . Years of education: Not on file  . Highest education level: Not on file  Occupational History  . Not on file  Tobacco Use  . Smoking status: Former Smoker    Packs/day: 0.50    Years: 10.00    Pack years: 5.00    Types: Cigarettes    Quit date: 01/08/1999    Years since quitting: 21.4  . Smokeless tobacco: Never  Used  Vaping Use  . Vaping Use: Never used  Substance and Sexual Activity  . Alcohol use: No  . Drug use: Not Currently    Types: Marijuana    Comment: former user of marijuana  . Sexual activity: Not on file  Other Topics Concern  . Not on file  Social History Narrative  . Not on file   Social Determinants of Health   Financial Resource Strain: Low Risk   . Difficulty of Paying Living Expenses: Not hard at all  Food Insecurity: No Food Insecurity  . Worried About  Programme researcher, broadcasting/film/video in the Last Year: Never true  . Ran Out of Food in the Last Year: Never true  Transportation Needs: No Transportation Needs  . Lack of Transportation (Medical): No  . Lack of Transportation (Non-Medical): No  Physical Activity: Sufficiently Active  . Days of Exercise per Week: 6 days  . Minutes of Exercise per Session: 80 min  Stress: No Stress Concern Present  . Feeling of Stress : Only a little  Social Connections: Socially Isolated  . Frequency of Communication with Friends and Family: Twice a week  . Frequency of Social Gatherings with Friends and Family: Never  . Attends Religious Services: More than 4 times per year  . Active Member of Clubs or Organizations: No  . Attends Banker Meetings: Never  . Marital Status: Never married    Tobacco Counseling Counseling given: Not Answered   Clinical Intake:  Pre-visit preparation completed: Yes  Pain : No/denies pain     BMI - recorded: 21.16 Nutritional Status: BMI of 19-24  Normal Nutritional Risks: None Diabetes: No  How often do you need to have someone help you when you read instructions, pamphlets, or other written materials from your doctor or pharmacy?: 1 - Never  Diabetic?No  Interpreter Needed?: No  Information entered by :: Lanier Ensign, LPN   Activities of Daily Living In your present state of health, do you have any difficulty performing the following activities: 06/21/2020  Hearing? Y  Comment wears hearing  aids  Vision? N  Difficulty concentrating or making decisions? Y  Comment short term memory  Walking or climbing stairs? N  Dressing or bathing? N  Doing errands, shopping? N  Preparing Food and eating ? N  Using the Toilet? N  In the past six months, have you accidently leaked urine? N  Do you have problems with loss of bowel control? N  Managing your Medications? N  Managing your Finances? N  Housekeeping or managing your Housekeeping? N  Some recent  data might be hidden    Patient Care Team: Kristian Covey, MD as PCP - General  Indicate any recent Medical Services you may have received from other than [facility] providers in the past year (date may be approximate).     Assessment:   This is a routine wellness examination for [facility].  Hearing/Vision screen  Hearing Screening   125Hz  250Hz  500Hz  1000Hz  2000Hz  3000Hz  4000Hz  6000Hz  8000Hz   Right ear:           Left ear:           Comments: Wears hearing aids   Vision Screening Comments: Pt will have to find appt for what insurance covers  Dietary issues and exercise activities discussed: Current Exercise Habits: Home exercise routine, Type of exercise: walking;calisthenics, Time (Minutes): 60, Frequency (Times/Week): 6, Weekly Exercise (Minutes/Week): 360  Goals    . Patient Stated  Cut back on pastries      Depression Screen PHQ 2/9 Scores 06/21/2020 12/07/2019 05/28/2017  PHQ - 2 Score 1 0 0    Fall Risk Fall Risk  06/21/2020 05/28/2017  Falls in the past year? 0 No  Number falls in past yr: 0 -  Injury with Fall? 0 -  Follow up Falls prevention discussed -    Any stairs in or around the home? Yes  If so, are there any without handrails? No  Home free of loose throw rugs in walkways, pet beds, electrical cords, etc? Yes  Adequate lighting in your home to reduce risk of falls? Yes   ASSISTIVE DEVICES UTILIZED TO PREVENT FALLS:  Life alert? No  Use of a cane, walker or w/c? No  Grab bars in the bathroom? No  Shower chair or bench in shower? No  Elevated toilet seat or a handicapped toilet? No   TIMED UP AND GO:  Was the test performed? No .      Cognitive Function:     6CIT Screen 06/21/2020  What Year? 0 points  What month? 0 points  Count back from 20 0 points  Months in reverse 0 points  Repeat phrase 2 points    Immunizations Immunization History  Administered Date(s) Administered  . Influenza,inj,Quad PF,6+ Mos 05/28/2017  . PFIZER  SARS-COV-2 Vaccination 11/14/2019, 12/05/2019  . Td 02/23/2010  . Tdap 11/05/2018    TDAP status: Up to date   Flu Vaccine status: Declined, Education has been provided regarding the importance of this vaccine but patient still declined. Advised may receive this vaccine at local pharmacy or Health Dept. Aware to provide a copy of the vaccination record if obtained from local pharmacy or Health Dept. Verbalized acceptance and understanding.  Covid-19 vaccine status: Completed vaccines  Qualifies for Shingles Vaccine? Yes   Zostavax completed No   Shingrix Completed?: No.    Education has been provided regarding the importance of this vaccine. Patient has been advised to call insurance company to determine out of pocket expense if they have not yet received this vaccine. Advised may also receive vaccine at local pharmacy or Health Dept. Verbalized acceptance and understanding.  Screening Tests Health Maintenance  Topic Date Due  . COLONOSCOPY  08/22/2020 (Originally 06/12/2020)  . INFLUENZA VACCINE  11/17/2020 (Originally 03/20/2020)  . TETANUS/TDAP  11/04/2028  . COVID-19 Vaccine  Completed  . Hepatitis C Screening  Completed  . HIV Screening  Completed    Health Maintenance  There are no preventive care reminders to display for this patient.  Colorectal cancer screening: No longer required. pt postponed until 1st of the year  Additional Screening:  Hepatitis C Screening Completed 05/28/17  Vision Screening: Recommended annual ophthalmology exams for early detection of glaucoma and other disorders of the eye. Is the patient up to date with their annual eye exam?  No  Who is the provider or what is the name of the office in which the patient attends annual eye exams? Pt states he will make appt    Dental Screening: Recommended annual dental exams for proper oral hygiene  Community Resource Referral / Chronic Care Management: CRR required this visit?  No   CCM required this  visit?  No      Plan:     I have personally reviewed and noted the following in the patient's chart:   . Medical and social history . Use of alcohol, tobacco or illicit drugs  . Current medications and supplements .  Functional ability and status . Nutritional status . Physical activity . Advanced directives . List of other physicians . Hospitalizations, surgeries, and ER visits in previous 12 months . Vitals . Screenings to include cognitive, depression, and falls . Referrals and appointments  In addition, I have reviewed and discussed with patient certain preventive protocols, quality metrics, and best practice recommendations. A written personalized care plan for preventive services as well as general preventive health recommendations were provided to patient.     Marzella Schlein, LPN   [id number]   Nurse Notes: None

## 2020-06-21 NOTE — Patient Instructions (Addendum)
[name] , Thank you for taking time to come for your Medicare Wellness Visit. I appreciate your ongoing commitment to your health goals. Please review the following plan we discussed and let me know if I can assist you in the future.   Screening recommendations/referrals: Colonoscopy: Postponed until next year Recommended yearly ophthalmology/optometry visit for glaucoma screening and checkup Recommended yearly dental visit for hygiene and checkup  Vaccinations: Influenza vaccine: (Unconfirmed) pt states he recieved Tdap vaccine: Done 11/05/18 Shingles vaccine: Shingrix discussed. Please contact your pharmacy for coverage information.    Covid-19: Completed 11/14/19 & 12/05/19 per pt   Advanced directives: Advance directive discussed with you today. I have provided a copy for you to complete at home and have notarized. Once this is complete please bring a copy in to our office so we can scan it into your chart.  Conditions/risks identified: Cut back on pastries  Next appointment: Follow up in one year for your annual wellness visit    Preventive Care 40-64 Years, Male Preventive care refers to lifestyle choices and visits with your health care provider that can promote health and wellness. What does preventive care include?  A yearly physical exam. This is also called an annual well check.  Dental exams once or twice a year.  Routine eye exams. Ask your health care provider how often you should have your eyes checked.  Personal lifestyle choices, including:  Daily care of your teeth and gums.  Regular physical activity.  Eating a healthy diet.  Avoiding tobacco and drug use.  Limiting alcohol use.  Practicing safe sex.  Taking low-dose aspirin every day starting at age 71. What happens during an annual well check? The services and screenings done by your health care provider during your annual well check will depend on your age, overall health, lifestyle risk factors,  and family history of disease. Counseling  Your health care provider may ask you questions about your:  Alcohol use.  Tobacco use.  Drug use.  Emotional well-being.  Home and relationship well-being.  Sexual activity.  Eating habits.  Work and work Astronomer. Screening  You may have the following tests or measurements:  Height, weight, and BMI.  Blood pressure.  Lipid and cholesterol levels. These may be checked every 5 years, or more frequently if you are over 61 years old.  Skin check.  Lung cancer screening. You may have this screening every year starting at age 75 if you have a 30-pack-year history of smoking and currently smoke or have quit within the past 15 years.  Fecal occult blood test (FOBT) of the stool. You may have this test every year starting at age 93.  Flexible sigmoidoscopy or colonoscopy. You may have a sigmoidoscopy every 5 years or a colonoscopy every 10 years starting at age 59.  Prostate cancer screening. Recommendations will vary depending on your family history and other risks.  Hepatitis C blood test.  Hepatitis B blood test.  Sexually transmitted disease (STD) testing.  Diabetes screening. This is done by checking your blood sugar (glucose) after you have not eaten for a while (fasting). You may have this done every 1-3 years. Discuss your test results, treatment options, and if necessary, the need for more tests with your health care provider. Vaccines  Your health care provider may recommend certain vaccines, such as:  Influenza vaccine. This is recommended every year.  Tetanus, diphtheria, and acellular pertussis (Tdap, Td) vaccine. You may need a Td booster every 10 years.  Zoster  vaccine. You may need this after age 1.  Pneumococcal 13-valent conjugate (PCV13) vaccine. You may need this if you have certain conditions and have not been vaccinated.  Pneumococcal polysaccharide (PPSV23) vaccine. You may need one or two doses if  you smoke cigarettes or if you have certain conditions. Talk to your health care provider about which screenings and vaccines you need and how often you need them. This information is not intended to replace advice given to you by your health care provider. Make sure you discuss any questions you have with your health care provider. Document Released: 09/02/2015 Document Revised: 04/25/2016 Document Reviewed: 06/07/2015 Elsevier Interactive Patient Education  2017 [place] Prevention in the Home Falls can cause injuries. They can happen to people of all ages. There are many things you can do to make your home safe and to help prevent falls. What can I do on the outside of my home?  Regularly fix the edges of walkways and driveways and fix any cracks.  Remove anything that might make you trip as you walk through a door, such as a raised step or threshold.  Trim any bushes or trees on the path to your home.  Use bright outdoor lighting.  Clear any walking paths of anything that might make someone trip, such as rocks or tools.  Regularly check to see if handrails are loose or broken. Make sure that both sides of any steps have handrails.  Any raised decks and porches should have guardrails on the edges.  Have any leaves, snow, or ice cleared regularly.  Use sand or salt on walking paths during winter.  Clean up any spills in your garage right away. This includes oil or grease spills. What can I do in the bathroom?  Use night lights.  Install grab bars by the toilet and in the tub and shower. Do not use towel bars as grab bars.  Use non-skid mats or decals in the tub or shower.  If you need to sit down in the shower, use a plastic, non-slip stool.  Keep the floor dry. Clean up any water that spills on the floor as soon as it happens.  Remove soap buildup in the tub or shower regularly.  Attach bath mats securely with double-sided non-slip rug tape.  Do not have  throw rugs and other things on the floor that can make you trip. What can I do in the bedroom?  Use night lights.  Make sure that you have a light by your bed that is easy to reach.  Do not use any sheets or blankets that are too big for your bed. They should not hang down onto the floor.  Have a firm chair that has side arms. You can use this for support while you get dressed.  Do not have throw rugs and other things on the floor that can make you trip. What can I do in the kitchen?  Clean up any spills right away.  Avoid walking on wet floors.  Keep items that you use a lot in easy-to-reach places.  If you need to reach something above you, use a strong step stool that has a grab bar.  Keep electrical cords out of the way.  Do not use floor polish or wax that makes floors slippery. If you must use wax, use non-skid floor wax.  Do not have throw rugs and other things on the floor that can make you trip. What can I do with my  stairs?  Do not leave any items on the stairs.  Make sure that there are handrails on both sides of the stairs and use them. Fix handrails that are broken or loose. Make sure that handrails are as long as the stairways.  Check any carpeting to make sure that it is firmly attached to the stairs. Fix any carpet that is loose or worn.  Avoid having throw rugs at the top or bottom of the stairs. If you do have throw rugs, attach them to the floor with carpet tape.  Make sure that you have a light switch at the top of the stairs and the bottom of the stairs. If you do not have them, ask someone to add them for you. What else can I do to help prevent falls?  Wear shoes that:  Do not have high heels.  Have rubber bottoms.  Are comfortable and fit you well.  Are closed at the toe. Do not wear sandals.  If you use a stepladder:  Make sure that it is fully opened. Do not climb a closed stepladder.  Make sure that both sides of the stepladder are  locked into place.  Ask someone to hold it for you, if possible.  Clearly mark and make sure that you can see:  Any grab bars or handrails.  First and last steps.  Where the edge of each step is.  Use tools that help you move around (mobility aids) if they are needed. These include:  Canes.  Walkers.  Scooters.  Crutches.  Turn on the lights when you go into a dark area. Replace any light bulbs as soon as they burn out.  Set up your furniture so you have a clear path. Avoid moving your furniture around.  If any of your floors are uneven, fix them.  If there are any pets around you, be aware of where they are.  Review your medicines with your doctor. Some medicines can make you feel dizzy. This can increase your chance of falling. Ask your doctor what other things that you can do to help prevent falls. This information is not intended to replace advice given to you by your health care provider. Make sure you discuss any questions you have with your health care provider. Document Released: 06/02/2009 Document Revised: 01/12/2016 Document Reviewed: 09/10/2014 Elsevier Interactive Patient Education  2017 [employer].

## 2020-07-13 DIAGNOSIS — F411 Generalized anxiety disorder: Secondary | ICD-10-CM | POA: Diagnosis not present

## 2020-07-13 DIAGNOSIS — F5101 Primary insomnia: Secondary | ICD-10-CM | POA: Diagnosis not present

## 2020-07-13 DIAGNOSIS — F209 Schizophrenia, unspecified: Secondary | ICD-10-CM | POA: Diagnosis not present

## 2020-09-08 DIAGNOSIS — F411 Generalized anxiety disorder: Secondary | ICD-10-CM | POA: Diagnosis not present

## 2020-09-08 DIAGNOSIS — F209 Schizophrenia, unspecified: Secondary | ICD-10-CM | POA: Diagnosis not present

## 2020-09-08 DIAGNOSIS — F5101 Primary insomnia: Secondary | ICD-10-CM | POA: Diagnosis not present

## 2020-11-08 ENCOUNTER — Other Ambulatory Visit: Payer: Self-pay

## 2020-11-09 ENCOUNTER — Encounter: Payer: Self-pay | Admitting: Family Medicine

## 2020-11-09 ENCOUNTER — Ambulatory Visit (INDEPENDENT_AMBULATORY_CARE_PROVIDER_SITE_OTHER): Payer: PPO | Admitting: Family Medicine

## 2020-11-09 VITALS — BP 110/70 | HR 55 | Temp 98.2°F | Ht 70.0 in | Wt 143.4 lb

## 2020-11-09 DIAGNOSIS — R972 Elevated prostate specific antigen [PSA]: Secondary | ICD-10-CM

## 2020-11-09 DIAGNOSIS — Z0000 Encounter for general adult medical examination without abnormal findings: Secondary | ICD-10-CM

## 2020-11-09 LAB — LIPID PANEL
Cholesterol: 179 mg/dL (ref 0–200)
HDL: 82.1 mg/dL (ref >39.00)
LDL Cholesterol: 90 mg/dL (ref 0–99)
NonHDL: 96.54
Total CHOL/HDL Ratio: 2
Triglycerides: 32 mg/dL (ref 0.0–149.0)
VLDL: 6.4 mg/dL (ref 0.0–40.0)

## 2020-11-09 LAB — HEPATIC FUNCTION PANEL
ALT: 15 U/L (ref 0–53)
AST: 16 U/L (ref 0–37)
Albumin: 4.3 g/dL (ref 3.5–5.2)
Alkaline Phosphatase: 61 U/L (ref 39–117)
Bilirubin, Direct: 0.2 mg/dL (ref 0.0–0.3)
Total Bilirubin: 1 mg/dL (ref 0.2–1.2)
Total Protein: 6.3 g/dL (ref 6.0–8.3)

## 2020-11-09 LAB — BASIC METABOLIC PANEL
BUN: 8 mg/dL (ref 6–23)
CO2: 24 mEq/L (ref 19–32)
Calcium: 9.1 mg/dL (ref 8.4–10.5)
Chloride: 106 mEq/L (ref 96–112)
Creatinine, Ser: 0.84 mg/dL (ref 0.40–1.50)
GFR: 95.85 mL/min (ref >60.00)
Glucose, Bld: 88 mg/dL (ref 70–99)
Potassium: 4 mEq/L (ref 3.5–5.1)
Sodium: 138 mEq/L (ref 135–145)

## 2020-11-09 LAB — CBC WITH DIFFERENTIAL/PLATELET
Basophils Absolute: 0.1 10*3/uL (ref 0.0–0.1)
Basophils Relative: 1.2 % (ref 0.0–3.0)
Eosinophils Absolute: 0.2 10*3/uL (ref 0.0–0.7)
Eosinophils Relative: 3.5 % (ref 0.0–5.0)
HCT: 39.6 % (ref 39.0–52.0)
Hemoglobin: 13.5 g/dL (ref 13.0–17.0)
Lymphocytes Relative: 30.1 % (ref 12.0–46.0)
Lymphs Abs: 1.6 10*3/uL (ref 0.7–4.0)
MCHC: 34.2 g/dL (ref 30.0–36.0)
MCV: 75.2 fl — ABNORMAL LOW (ref 78.0–100.0)
Monocytes Absolute: 0.4 10*3/uL (ref 0.1–1.0)
Monocytes Relative: 7 % (ref 3.0–12.0)
Neutro Abs: 3 10*3/uL (ref 1.4–7.7)
Neutrophils Relative %: 58.2 % (ref 43.0–77.0)
Platelets: 235 10*3/uL (ref 150.0–400.0)
RBC: 5.26 Mil/uL (ref 4.22–5.81)
RDW: 14.4 % (ref 11.5–15.5)
WBC: 5.2 10*3/uL (ref 4.0–10.5)

## 2020-11-09 LAB — PSA: PSA: 10.53 ng/mL — ABNORMAL HIGH (ref 0.10–4.00)

## 2020-11-09 LAB — TSH: TSH: 0.82 u[IU]/mL (ref 0.35–4.50)

## 2020-11-09 NOTE — Progress Notes (Signed)
Established Patient Office Visit  Subjective:  Patient ID: [name], male    DOB: [birth year]  Age: 60 y.o. MRN: [id number]  CC:  Chief Complaint  Patient presents with  . Annual Exam    HPI [name] presents for physical exam.  He has history of schizophrenia and is followed by psychiatry for that.  Generally doing well.  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  He is apparently taking risperidone, doxepin, and BuSpar.  He had elevated PSA couple years ago and was referred to urology but apparently never went.  He had fear of biopsy and never followed through with that.  Denies any slow stream  Health maintenance reviewed  -He is overdue for follow-up colonoscopy. -Immunizations up-to-date with exception that he never got Covid booster vaccine.  Also no history of shingles vaccine.  Family history-mother had history of gastric cancer but is alive.  Father's health is unknown.  He has 1 brother and 1 sister.  He has a sister with obesity.  Social history-he is single.  Quit smoking 2008.  No alcohol use.  Works part-time as above.  Past Medical History:  Diagnosis Date  . ADVEF, DRUG/MEDICINAL/BIOLOGICAL SUBST NOS 03/31/2007  . SCHIZOPHRENIA, PARANOID, UNSPECIFIED 03/31/2007  . Sebaceous cyst 01/24/2010    Past Surgical History:  Procedure Laterality Date  . ANKLE SURGERY     fracture    Family History  Problem Relation Age of Onset  . Cancer Mother        stomach    Social History   Socioeconomic History  . Marital status: Single    Spouse name: Not on file  . Number of children: Not on file  . Years of education: Not on file  . Highest education level: Not on file  Occupational History  . Not on file  Tobacco Use  . Smoking status: Former Smoker    Packs/day: 0.50    Years: 10.00    Pack years: 5.00    Types: Cigarettes    Quit date: 01/08/1999    Years since quitting: 21.8  . Smokeless tobacco: Never Used  Vaping Use  .  Vaping Use: Never used  Substance and Sexual Activity  . Alcohol use: No  . Drug use: Not Currently    Types: Marijuana    Comment: former user of marijuana  . Sexual activity: Not on file  Other Topics Concern  . Not on file  Social History Narrative  . Not on file   Social Determinants of Health   Financial Resource Strain: Low Risk   . Difficulty of Paying Living Expenses: Not hard at all  Food Insecurity: No Food Insecurity  . Worried About Programme researcher, broadcasting/film/video in the Last Year: Never true  . Ran Out of Food in the Last Year: Never true  Transportation Needs: No Transportation Needs  . Lack of Transportation (Medical): No  . Lack of Transportation (Non-Medical): No  Physical Activity: Sufficiently Active  . Days of Exercise per Week: 6 days  . Minutes of Exercise per Session: 80 min  Stress: No Stress Concern Present  . Feeling of Stress : Only a little  Social Connections: Socially Isolated  . Frequency of Communication with Friends and Family: Twice a week  . Frequency of Social Gatherings with Friends and Family: Never  . Attends Religious Services: More than 4 times per year  . Active Member of Clubs or Organizations: No  . Attends Banker  Meetings: Never  . Marital Status: Never married  Intimate Partner Violence: Not At Risk  . Fear of Current or Ex-Partner: No  . Emotionally Abused: No  . Physically Abused: No  . Sexually Abused: No    Outpatient Medications Prior to Visit  Medication Sig Dispense Refill  . busPIRone (BUSPAR) 10 MG tablet Take 10 mg by mouth 3 (three) times daily.    Marland Kitchen doxepin (SINEQUAN) 10 MG capsule     . Garlic 500 MG TABS Take 1 tablet by mouth daily.    . Multiple Vitamin (MULTIVITAMIN WITH MINERALS) TABS tablet Take 1 tablet by mouth daily.    . risperiDONE (RISPERDAL) 3 MG tablet     . triamcinolone cream (KENALOG) 0.5 % Apply 1 application topically 2 (two) times daily. 30 g 1   No facility-administered medications  prior to visit.    Allergies  Allergen Reactions  . Seasonal Ic [Cholestatin]     Itchy eyes, pollen    ROS Review of Systems  Constitutional: Negative for activity change, appetite change, fatigue and fever.  HENT: Negative for congestion, ear pain and trouble swallowing.   Eyes: Negative for pain and visual disturbance.  Respiratory: Negative for cough, shortness of breath and wheezing.   Cardiovascular: Negative for chest pain and palpitations.  Gastrointestinal: Negative for abdominal distention, abdominal pain, blood in stool, constipation, diarrhea, nausea, rectal pain and vomiting.  Genitourinary: Negative for dysuria, hematuria and testicular pain.  Musculoskeletal: Negative for arthralgias and joint swelling.  Skin: Negative for rash.  Neurological: Negative for dizziness, syncope and headaches.  Hematological: Negative for adenopathy.  Psychiatric/Behavioral: Negative for confusion and dysphoric mood.      Objective:    Physical Exam Constitutional:      General: He is not in acute distress.    Appearance: He is well-developed.  HENT:     Head: Normocephalic and atraumatic.     Right Ear: External ear normal.     Left Ear: External ear normal.  Eyes:     Conjunctiva/sclera: Conjunctivae normal.     Pupils: Pupils are equal, round, and reactive to light.  Neck:     Thyroid: No thyromegaly.  Cardiovascular:     Rate and Rhythm: Normal rate and regular rhythm.     Heart sounds: Normal heart sounds. No murmur heard.   Pulmonary:     Effort: No respiratory distress.     Breath sounds: No wheezing or rales.  Abdominal:     General: Bowel sounds are normal. There is no distension.     Palpations: Abdomen is soft. There is no mass.     Tenderness: There is no abdominal tenderness. There is no guarding or rebound.  Genitourinary:    Comments: Prostate is moderately enlarged but nontender.  No masses palpated. Musculoskeletal:     Cervical back: Normal range of  motion and neck supple.  Lymphadenopathy:     Cervical: No cervical adenopathy.  Skin:    Findings: No rash.  Neurological:     Mental Status: He is alert and oriented to person, place, and time.     Cranial Nerves: No cranial nerve deficit.     Deep Tendon Reflexes: Reflexes normal.     BP 110/70   Pulse (!) 55   Temp 98.2 F (36.8 C) (Oral)   Ht 5\' 10"  (1.778 m)   Wt 143 lb 7 oz (65.1 kg)   SpO2 98%   BMI 20.58 kg/m  Wt Readings from Last 3 Encounters:  11/09/20 143 lb 7 oz (65.1 kg)  12/07/19 147 lb 8 oz (66.9 kg)  01/23/19 140 lb 12.8 oz (63.9 kg)     Health Maintenance Due  Topic Date Due  . COVID-19 Vaccine (3 - Booster for Pfizer series) 06/05/2020  . COLONOSCOPY (Pts 45-57yrs Insurance coverage will need to be confirmed)  06/12/2020    There are no preventive care reminders to display for this patient.  Lab Results  Component Value Date   TSH 0.58 01/23/2019   Lab Results  Component Value Date   WBC 7.0 01/23/2019   HGB 14.2 01/23/2019   HCT 41.6 01/23/2019   MCV 78.0 01/23/2019   PLT 228.0 01/23/2019   Lab Results  Component Value Date   NA 139 01/23/2019   K 4.2 01/23/2019   CO2 28 01/23/2019   GLUCOSE 92 01/23/2019   BUN 10 01/23/2019   CREATININE 0.97 01/23/2019   BILITOT 0.8 01/23/2019   ALKPHOS 69 01/23/2019   AST 16 01/23/2019   ALT 11 01/23/2019   PROT 6.3 01/23/2019   ALBUMIN 4.4 01/23/2019   CALCIUM 9.2 01/23/2019   GFR 96.49 01/23/2019   Lab Results  Component Value Date   CHOL 168 01/23/2019   Lab Results  Component Value Date   HDL 80.00 01/23/2019   Lab Results  Component Value Date   LDLCALC 82 01/23/2019   Lab Results  Component Value Date   TRIG 33.0 01/23/2019   Lab Results  Component Value Date   CHOLHDL 2 01/23/2019   Lab Results  Component Value Date   HGBA1C 5.1 07/05/2008      Assessment & Plan:   Problem List Items Addressed This Visit   None   Visit Diagnoses    Physical exam    -   Primary   Relevant Orders   Basic metabolic panel   Lipid panel   CBC with Differential/Platelet   TSH   Hepatic function panel   PSA    History of elevated PSA and needs follow-up.  If this remains elevated we strongly advise he follow through with urology referral.  He is aware that elevated PSA can represent prostate cancer which can be fatal if not further evaluated.  He agrees to following through with urologist if this remains up  -Set up repeat colonoscopy  -Obtain follow-up labs including PSA  -Consider shingles vaccine  No orders of the defined types were placed in this encounter.   Follow-up: No follow-ups on file.    Evelena Peat, MD

## 2020-11-09 NOTE — Patient Instructions (Signed)

## 2020-11-10 NOTE — Addendum Note (Signed)
Addended by: Johnella Moloney on: 11/10/2020 11:02 AM   Modules accepted: Orders

## 2020-11-28 DIAGNOSIS — F411 Generalized anxiety disorder: Secondary | ICD-10-CM | POA: Diagnosis not present

## 2020-11-28 DIAGNOSIS — F209 Schizophrenia, unspecified: Secondary | ICD-10-CM | POA: Diagnosis not present

## 2020-11-28 DIAGNOSIS — F5101 Primary insomnia: Secondary | ICD-10-CM | POA: Diagnosis not present

## 2021-02-13 DIAGNOSIS — F411 Generalized anxiety disorder: Secondary | ICD-10-CM | POA: Diagnosis not present

## 2021-02-13 DIAGNOSIS — F5101 Primary insomnia: Secondary | ICD-10-CM | POA: Diagnosis not present

## 2021-02-13 DIAGNOSIS — F209 Schizophrenia, unspecified: Secondary | ICD-10-CM | POA: Diagnosis not present

## 2021-03-27 ENCOUNTER — Encounter: Payer: Self-pay | Admitting: Physician Assistant

## 2021-04-21 ENCOUNTER — Ambulatory Visit: Payer: PPO | Admitting: Physician Assistant

## 2021-04-21 ENCOUNTER — Encounter: Payer: Self-pay | Admitting: Physician Assistant

## 2021-04-21 VITALS — BP 100/70 | HR 72 | Ht 69.5 in | Wt 145.4 lb

## 2021-04-21 DIAGNOSIS — K5900 Constipation, unspecified: Secondary | ICD-10-CM

## 2021-04-21 DIAGNOSIS — Z800 Family history of malignant neoplasm of digestive organs: Secondary | ICD-10-CM

## 2021-04-21 MED ORDER — SUTAB 1479-225-188 MG PO TABS: 1.0000 | ORAL_TABLET | Freq: Once | ORAL | 0 refills | Status: AC

## 2021-04-21 NOTE — Progress Notes (Signed)
I agree with the above note, plan 

## 2021-04-21 NOTE — Patient Instructions (Signed)
Start Miralax 1 capful in 8 ounces of liquid daily.   You have been scheduled for a colonoscopy. Please follow written instructions given to you at your visit today.  Please pick up your prep supplies at the pharmacy within the next 1-3 days. If you use inhalers (even only as needed), please bring them with you on the day of your procedure.  If you are age 59 or older, your body mass index should be between 23-30. Your Body mass index is 21.16 kg/m. If this is out of the aforementioned range listed, please consider follow up with your Primary Care Provider.  If you are age 13 or younger, your body mass index should be between 19-25. Your Body mass index is 21.16 kg/m. If this is out of the aformentioned range listed, please consider follow up with your Primary Care Provider.   __________________________________________________________  The [facility] providers would like to encourage you to use [facility] to communicate with providers for non-urgent requests or questions.  Due to long hold times on the telephone, sending your provider a message by [facility] may be a faster and more efficient way to get a response.  Please allow 48 business hours for a response.  Please remember that this is for non-urgent requests.

## 2021-04-21 NOTE — Progress Notes (Signed)
Chief Complaint: Constipation, family history of colon cancer  HPI:    [name] is a 60 year old male with a past medical history as listed below including schizophrenia, known to Dr. Christella Hartigan, who was referred to me by Kristian Covey, MD for a complaint of constipation and a family history of colon cancer.    05/2010 colonoscopy for family history of colon cancer with no polyps or cancers.  Repeat recommended in 5 years.    06/12/2016 office visit with Dr. Christella Hartigan.  That time discussed patient had paranoid schizophrenia and his family history changed even during that visit.  It was recommend that we just "assume" that his father colon cancer and it would put him at slightly increased risk for cancer himself and recommend repeat colonoscopy every 5 years.    Today, the patient presents to clinic and explains that about 6 months ago he had a change from his regular daily bowel movements telling me that he overindulged on food and "stuffed myself" for a month and then started feeling backed up.  Tells me that now he has to take Senokot every 3 days or so in order to get a good bowel movement, sometimes he will get stool in between then but feels like this is related to days that he eats fruits.    Denies fever, chills, blood in stool or weight loss.  Past Medical History:  Diagnosis Date   ADVEF, DRUG/MEDICINAL/BIOLOGICAL SUBST NOS 03/31/2007   Hayfever    SCHIZOPHRENIA, PARANOID, UNSPECIFIED 03/31/2007   Sebaceous cyst 01/24/2010    Past Surgical History:  Procedure Laterality Date   ANKLE SURGERY Right    fracture    Current Outpatient Medications  Medication Sig Dispense Refill   busPIRone (BUSPAR) 10 MG tablet Take 10 mg by mouth 3 (three) times daily.     doxepin (SINEQUAN) 10 MG capsule      Garlic 500 MG TABS Take 1 tablet by mouth daily.     Multiple Vitamin (MULTIVITAMIN WITH MINERALS) TABS tablet Take 1 tablet by mouth daily.     risperiDONE (RISPERDAL) 3 MG tablet       triamcinolone cream (KENALOG) 0.5 % Apply 1 application topically 2 (two) times daily. 30 g 1   No current facility-administered medications for this visit.    Allergies as of 04/21/2021 - Review Complete 04/21/2021  Allergen Reaction Noted   Seasonal ic [cholestatin]  06/21/2020    Family History  Problem Relation Age of Onset   Stomach cancer Mother    Cancer Maternal Grandmother        type unknown    Social History   Socioeconomic History   Marital status: Single    Spouse name: Not on file   Number of children: 0   Years of education: Not on file   Highest education level: Not on file  Occupational History   Occupation: janitor  Tobacco Use   Smoking status: Former    Packs/day: 0.50    Years: 10.00    Pack years: 5.00    Types: Cigarettes    Quit date: 01/08/1999    Years since quitting: 22.2   Smokeless tobacco: Never  Vaping Use   Vaping Use: Never used  Substance and Sexual Activity   Alcohol use: No   Drug use: Not Currently    Types: Marijuana    Comment: former user of marijuana   Sexual activity: Not on file  Other Topics Concern   Not on file  Social History  Narrative   Not on file   Social Determinants of Health   Financial Resource Strain: Low Risk    Difficulty of Paying Living Expenses: Not hard at all  Food Insecurity: No Food Insecurity   Worried About Programme researcher, broadcasting/film/video in the Last Year: Never true   Ran Out of Food in the Last Year: Never true  Transportation Needs: No Transportation Needs   Lack of Transportation (Medical): No   Lack of Transportation (Non-Medical): No  Physical Activity: Sufficiently Active   Days of Exercise per Week: 6 days   Minutes of Exercise per Session: 80 min  Stress: No Stress Concern Present   Feeling of Stress : Only a little  Social Connections: Socially Isolated   Frequency of Communication with Friends and Family: Twice a week   Frequency of Social Gatherings with Friends and Family: Never    Attends Religious Services: More than 4 times per year   Active Member of [employer] or Organizations: No   Attends Engineer, structural: Never   Marital Status: Never married  Catering manager Violence: Not At Risk   Fear of Current or Ex-Partner: No   Emotionally Abused: No   Physically Abused: No   Sexually Abused: No    Review of Systems:    Constitutional: No weight loss, fever or chills Skin: No rash Cardiovascular: No chest pain Respiratory: No SOB  Gastrointestinal: See HPI and otherwise negative Genitourinary: No dysuria  Neurological: No headache, dizziness or syncope Musculoskeletal: No new muscle or joint pain Hematologic: No bleeding  Psychiatric: No history of depression or anxiety   Physical Exam:  Vital signs: BP 100/70 (BP Location: Left Arm, Patient Position: Sitting, Cuff Size: Normal)   Pulse 72   Ht 5' 9.5" (1.765 m) Comment: height measured without shoes  Wt 145 lb 6 oz (65.9 kg)   BMI 21.16 kg/m   Constitutional:   Pleasant AA male appears to be in NAD, Well developed, Well nourished, alert and cooperative Head:  Normocephalic and atraumatic. Eyes:   PEERL, EOMI. No icterus. Conjunctiva pink. Ears:  Normal auditory acuity. Neck:  Supple Throat: Oral cavity and pharynx without inflammation, swelling or lesion.  Respiratory: Respirations even and unlabored. Lungs clear to auscultation bilaterally.   No wheezes, crackles, or rhonchi.  Cardiovascular: Normal S1, S2. No MRG. Regular rate and rhythm. No peripheral edema, cyanosis or pallor.  Gastrointestinal:  Soft, nondistended, nontender. No rebound or guarding. Normal bowel sounds. No appreciable masses or hepatomegaly. Rectal:  Not performed.  Msk:  Symmetrical without gross deformities. Without edema, no deformity or joint abnormality.  Neurologic:  Alert and  oriented x4;  grossly normal neurologically.  Skin:   Dry and intact without significant lesions or rashes. Psychiatric: Demonstrates good  judgement and reason without abnormal affect or behaviors.  RELEVANT LABS AND IMAGING: CBC    Component Value Date/Time   WBC 5.2 11/09/2020 1402   RBC 5.26 11/09/2020 1402   HGB 13.5 11/09/2020 1402   HCT 39.6 11/09/2020 1402   PLT 235.0 11/09/2020 1402   MCV 75.2 (L) 11/09/2020 1402   MCH 25.5 (L) 01/31/2012 1424   MCHC 34.2 11/09/2020 1402   RDW 14.4 11/09/2020 1402   LYMPHSABS 1.6 11/09/2020 1402   MONOABS 0.4 11/09/2020 1402   EOSABS 0.2 11/09/2020 1402   BASOSABS 0.1 11/09/2020 1402    CMP     Component Value Date/Time   NA 138 11/09/2020 1402   K 4.0 11/09/2020 1402   CL  106 11/09/2020 1402   CO2 24 11/09/2020 1402   GLUCOSE 88 11/09/2020 1402   BUN 8 11/09/2020 1402   CREATININE 0.84 11/09/2020 1402   CALCIUM 9.1 11/09/2020 1402   PROT 6.3 11/09/2020 1402   ALBUMIN 4.3 11/09/2020 1402   AST 16 11/09/2020 1402   ALT 15 11/09/2020 1402   ALKPHOS 61 11/09/2020 1402   BILITOT 1.0 11/09/2020 1402   GFRNONAA 68 (L) 01/31/2012 1424   GFRAA 78 (L) 01/31/2012 1424    Assessment: 1.  Constipation: Over the past 6 months, after "overindulging" per the patient, currently using Senokot every 3 days to have a bowel movement 2.  Family history of colon cancer: "Assumed" family history of colon cancer in patient's father, his last colonoscopy was in 2011, recommendations are for repeat every 5 years  Plan: 1.  Scheduled patient for a surveillance colonoscopy given family history of colon cancer.  This was scheduled with Dr. Christella Hartigan in the [facility].  Did provide the patient a detailed list of risks for the procedure and he agrees to proceed. Patient is appropriate for endoscopic procedure(s) in the ambulatory ([facility]) setting.  2.  Recommend the patient take MiraLAX once daily instead of Senokot every 3 days. 3.  Patient will have a 2-day bowel prep for procedure. 4.  Patient to follow in clinic per recommendations from Dr. Christella Hartigan after time of procedure.  Hyacinth Meeker,  PA-C [facility] 04/21/2021, 10:37 AM  Cc: Kristian Covey, MD

## 2021-05-29 ENCOUNTER — Encounter: Payer: Self-pay | Admitting: Gastroenterology

## 2021-05-29 ENCOUNTER — Other Ambulatory Visit: Payer: Self-pay

## 2021-05-29 ENCOUNTER — Ambulatory Visit (AMBULATORY_SURGERY_CENTER): Payer: PPO | Admitting: Gastroenterology

## 2021-05-29 VITALS — BP 106/69 | HR 54 | Temp 98.0°F | Resp 12 | Ht 69.5 in | Wt 145.0 lb

## 2021-05-29 DIAGNOSIS — Z800 Family history of malignant neoplasm of digestive organs: Secondary | ICD-10-CM | POA: Diagnosis not present

## 2021-05-29 DIAGNOSIS — K5900 Constipation, unspecified: Secondary | ICD-10-CM | POA: Diagnosis not present

## 2021-05-29 DIAGNOSIS — Z1211 Encounter for screening for malignant neoplasm of colon: Secondary | ICD-10-CM | POA: Diagnosis not present

## 2021-05-29 DIAGNOSIS — Z538 Procedure and treatment not carried out for other reasons: Secondary | ICD-10-CM | POA: Diagnosis not present

## 2021-05-29 DIAGNOSIS — F200 Paranoid schizophrenia: Secondary | ICD-10-CM | POA: Diagnosis not present

## 2021-05-29 MED ORDER — SODIUM CHLORIDE 0.9 % IV SOLN: 500.0000 mL | Freq: Once | INTRAVENOUS | Status: DC

## 2021-05-29 NOTE — Progress Notes (Signed)
VS completed by DT.    Medical history reviewed and updated.  

## 2021-05-29 NOTE — Op Note (Signed)
[facility] Patient Name: [name] Procedure Date: 05/29/2021 3:34 PM MRN: [id number] Endoscopist: Rachael Fee , MD Age: 60 Referring MD:  Date of Birth: July 23, 1961 Gender: Male Account #: [id number] Procedure:                Colonoscopy Indications:              Screening for colorectal malignant neoplasm Procedure:                Pre-Anesthesia Assessment:                           - Prior to the procedure, a History and Physical                            was performed, and patient medications and                            allergies were reviewed. The patient's tolerance of                            previous anesthesia was also reviewed. The risks                            and benefits of the procedure and the sedation                            options and risks were discussed with the patient.                            All questions were answered, and informed consent                            was obtained. Prior Anticoagulants: The patient has                            taken no previous anticoagulant or antiplatelet                            agents. ASA Grade Assessment: II - A patient with                            mild systemic disease. After reviewing the risks                            and benefits, the patient was deemed in                            satisfactory condition to undergo the procedure.                           After obtaining informed consent, the colonoscope                            was passed under direct vision. Throughout the  procedure, the patient's blood pressure, pulse, and                            oxygen saturations were monitored continuously. The                            CF [id number] #[id number] was introduced through the anus                            with the intention of advancing to the cecum. The                            scope was advanced to the sigmoid colon before the                             procedure was aborted. Medications were given. The                            colonoscopy was performed without difficulty. The                            patient tolerated the procedure well. The quality                            of the bowel preparation was poor. Scope In: 3:40:40 PM Scope Out: 3:41:36 PM Total Procedure Duration: 0 hours 0 minutes 56 seconds  Findings:                 Solid stool was found in the rectum and sigmoid and                            so the procedure was aborted. Complications:            No immediate complications. Estimated blood loss:                            None. Estimated Blood Loss:     Estimated blood loss: none. Impression:               - Solid stool was found in the rectum and sigmoid                            and so the procedure was aborted.                           - This was an INCOMPLETE EXAMINATION. Recommendation:           - Patient has a contact number available for                            emergencies. The signs and symptoms of potential                            delayed complications were discussed with the  patient. Return to normal activities tomorrow.                            Written discharge instructions were provided to the                            patient.                           - Resume previous diet.                           - Continue present medications.                           - Repeat colonoscopy at the next available                            appointment for screening purposes. He was written                            to do a 'double prep' protocol for this exam, not                            sure that was done correctly after this very                            limited exam. Rachael Fee, MD 05/29/2021 3:46:01 PM This report has been signed electronically.

## 2021-05-29 NOTE — Patient Instructions (Addendum)
You were not cleaned out.  Your new procedure date is on the back page. Read all of the handouts given to you by your recovery room nurse.  YOU HAD AN ENDOSCOPIC PROCEDURE TODAY AT THE [facility]:   Refer to the procedure report that was given to you for any specific questions about what was found during the examination.  If the procedure report does not answer your questions, please call your gastroenterologist to clarify.  If you requested that your care partner not be given the details of your procedure findings, then the procedure report has been included in a sealed envelope for you to review at your convenience later.  YOU SHOULD EXPECT: Some feelings of bloating in the abdomen. Passage of more gas than usual.  Walking can help get rid of the air that was put into your GI tract during the procedure and reduce the bloating. If you had a lower endoscopy (such as a colonoscopy or flexible sigmoidoscopy) you may notice spotting of blood in your stool or on the toilet paper. If you underwent a bowel prep for your procedure, you may not have a normal bowel movement for a few days.  Please Note:  You might notice some irritation and congestion in your nose or some drainage.  This is from the oxygen used during your procedure.  There is no need for concern and it should clear up in a day or so.  SYMPTOMS TO REPORT IMMEDIATELY:  Following lower endoscopy (colonoscopy or flexible sigmoidoscopy):  Excessive amounts of blood in the stool  Significant tenderness or worsening of abdominal pains  Swelling of the abdomen that is new, acute  Fever of 100F or higher    For urgent or emergent issues, a gastroenterologist can be reached at any hour by calling [phone]. Do not use MyChart messaging for urgent concerns.    DIET:  We do recommend a small meal at first, but then you may proceed to your regular diet.  Drink plenty of fluids but you should avoid alcoholic beverages for 24  hours.  ACTIVITY:  You should plan to take it easy for the rest of today and you should NOT DRIVE or use heavy machinery until tomorrow (because of the sedation medicines used during the test).    FOLLOW UP: Our staff will call the number listed on your records 48-72 hours following your procedure to check on you and address any questions or concerns that you may have regarding the information given to you following your procedure. If we do not reach you, we will leave a message.  We will attempt to reach you two times.  During this call, we will ask if you have developed any symptoms of COVID 19. If you develop any symptoms (ie: fever, flu-like symptoms, shortness of breath, cough etc.) before then, please call [phone].  If you test positive for Covid 19 in the 2 weeks post procedure, please call and report this information to [place].     SIGNATURES/CONFIDENTIALITY: You and/or your care partner have signed paperwork which will be entered into your electronic medical record.  These signatures attest to the fact that that the information above on your After Visit Summary has been reviewed and is understood.  Full responsibility of the confidentiality of this discharge information lies with you and/or your care-partner.

## 2021-05-29 NOTE — Progress Notes (Signed)
HPI: This is a man with possible FH of colon cancer   ROS: complete GI ROS as described in HPI, all other review negative.  Constitutional:  No unintentional weight loss   Past Medical History:  Diagnosis Date   ADVEF, DRUG/MEDICINAL/BIOLOGICAL SUBST NOS 03/31/2007   Allergy    Hayfever    SCHIZOPHRENIA, PARANOID, UNSPECIFIED 03/31/2007   Sebaceous cyst 01/24/2010    Past Surgical History:  Procedure Laterality Date   ANKLE SURGERY Right    fracture   COLONOSCOPY      Current Outpatient Medications  Medication Sig Dispense Refill   busPIRone (BUSPAR) 10 MG tablet Take 10 mg by mouth 3 (three) times daily.     doxepin (SINEQUAN) 10 MG capsule      Garlic 500 MG TABS Take 1 tablet by mouth daily.     Multiple Vitamin (MULTIVITAMIN WITH MINERALS) TABS tablet Take 1 tablet by mouth daily.     risperiDONE (RISPERDAL) 3 MG tablet      triamcinolone cream (KENALOG) 0.5 % Apply 1 application topically 2 (two) times daily. 30 g 1   Current Facility-Administered Medications  Medication Dose Route Frequency Provider Last Rate Last Admin   0.9 %  sodium chloride infusion  500 mL Intravenous Once Rachael Fee, MD        Allergies as of 05/29/2021 - Review Complete 05/29/2021  Allergen Reaction Noted   Seasonal ic [cholestatin]  06/21/2020    Family History  Problem Relation Age of Onset   Stomach cancer Mother    Cancer Maternal Grandmother        type unknown   Colon cancer Neg Hx    Rectal cancer Neg Hx     Social History   Socioeconomic History   Marital status: Single    Spouse name: Not on file   Number of children: 0   Years of education: Not on file   Highest education level: Not on file  Occupational History   Occupation: janitor  Tobacco Use   Smoking status: Former    Packs/day: 0.50    Years: 10.00    Pack years: 5.00    Types: Cigarettes    Quit date: 01/08/1999    Years since quitting: 22.4   Smokeless tobacco: Never  Vaping Use   Vaping  Use: Never used  Substance and Sexual Activity   Alcohol use: No   Drug use: Not Currently    Types: Marijuana    Comment: former user of marijuana   Sexual activity: Not on file  Other Topics Concern   Not on file  Social History Narrative   Not on file   Social Determinants of Health   Financial Resource Strain: Low Risk    Difficulty of Paying Living Expenses: Not hard at all  Food Insecurity: No Food Insecurity   Worried About Programme researcher, broadcasting/film/video in the Last Year: Never true   Ran Out of Food in the Last Year: Never true  Transportation Needs: No Transportation Needs   Lack of Transportation (Medical): No   Lack of Transportation (Non-Medical): No  Physical Activity: Sufficiently Active   Days of Exercise per Week: 6 days   Minutes of Exercise per Session: 80 min  Stress: No Stress Concern Present   Feeling of Stress : Only a little  Social Connections: Socially Isolated   Frequency of Communication with Friends and Family: Twice a week   Frequency of Social Gatherings with Friends and Family: Never   Attends Religious  Services: More than 4 times per year   Active Member of Clubs or Organizations: No   Attends Banker Meetings: Never   Marital Status: Never married  Catering manager Violence: Not At Risk   Fear of Current or Ex-Partner: No   Emotionally Abused: No   Physically Abused: No   Sexually Abused: No     Physical Exam: BP (!) 95/58   Pulse 62   Temp 98 F (36.7 C)   Ht 5' 9.5" (1.765 m)   Wt 145 lb (65.8 kg)   SpO2 100%   BMI 21.11 kg/m  Constitutional: generally well-appearing Psychiatric: alert and oriented x3 Lungs: CTA bilaterally Heart: no MCR  Assessment and plan: 60 y.o. male with possible FH of colon cancer  Colonsocoyp today  Care is appropriate for the ambulatory setting.  Rob Bunting, MD [facility] 05/29/2021, 3:32 PM

## 2021-05-29 NOTE — Progress Notes (Signed)
To PACU, VSS. Report to Rn.tb 

## 2021-05-30 DIAGNOSIS — F411 Generalized anxiety disorder: Secondary | ICD-10-CM | POA: Diagnosis not present

## 2021-05-30 DIAGNOSIS — F209 Schizophrenia, unspecified: Secondary | ICD-10-CM | POA: Diagnosis not present

## 2021-05-30 DIAGNOSIS — F5101 Primary insomnia: Secondary | ICD-10-CM | POA: Diagnosis not present

## 2021-05-31 ENCOUNTER — Telehealth: Payer: Self-pay | Admitting: *Deleted

## 2021-05-31 NOTE — Telephone Encounter (Signed)
  Follow up Call-  Call back number 05/29/2021  Post procedure Call Back phone  # [phone]  Permission to leave phone message Yes  Some recent data might be hidden     Patient questions:  Do you have a fever, pain , or abdominal swelling? No. Pain Score  0 *  Have you tolerated food without any problems? Yes.    Have you been able to return to your normal activities? Yes.    Do you have any questions about your discharge instructions: Diet   No. Medications  No. Follow up visit  No.  Do you have questions or concerns about your Care? No.  Actions: * If pain score is 4 or above: No action needed, pain <4.  Have you developed a fever since your procedure? no  2.   Have you had an respiratory symptoms (SOB or cough) since your procedure? no  3.   Have you tested positive for COVID 19 since your procedure no  4.   Have you had any family members/close contacts diagnosed with the COVID 19 since your procedure?  no   If yes to any of these questions please route to Laverna Peace, RN and Karlton Lemon, RN

## 2021-05-31 NOTE — Telephone Encounter (Signed)
Attempted to call patient for their post-procedure follow-up call. No answer. Left voicemail.   

## 2021-06-08 ENCOUNTER — Telehealth: Payer: Self-pay | Admitting: Gastroenterology

## 2021-06-08 NOTE — Telephone Encounter (Signed)
Patient called this morning.  Due to a work scheduling conflict, he is cancelling his colonoscopy for tomorrow.  He will call back to reschedule the appointment.

## 2021-06-09 ENCOUNTER — Encounter: Payer: PPO | Admitting: Gastroenterology

## 2021-06-27 ENCOUNTER — Ambulatory Visit: Payer: PPO

## 2021-07-03 ENCOUNTER — Ambulatory Visit (INDEPENDENT_AMBULATORY_CARE_PROVIDER_SITE_OTHER): Payer: PPO

## 2021-07-03 VITALS — Ht 70.0 in | Wt 145.0 lb

## 2021-07-03 DIAGNOSIS — Z0000 Encounter for general adult medical examination without abnormal findings: Secondary | ICD-10-CM | POA: Diagnosis not present

## 2021-07-03 NOTE — Progress Notes (Signed)
I connected with [name] today by telephone and verified that I am speaking with the correct person using two identifiers. Location patient: home Location provider: work Persons participating in the virtual visit: Caedyn Raygoza, Elisha Ponder LPN.   I discussed the limitations, risks, security and privacy concerns of performing an evaluation and management service by telephone and the availability of in person appointments. I also discussed with the patient that there may be a patient responsible charge related to this service. The patient expressed understanding and verbally consented to this telephonic visit.    Interactive audio and video telecommunications were attempted between this provider and patient, however failed, due to patient having technical difficulties OR patient did not have access to video capability.  We continued and completed visit with audio only.     Vital signs may be patient reported or missing.  Subjective:   [name] is a 60 y.o. male who presents for Medicare Annual/Subsequent preventive examination.  Review of Systems     Cardiac Risk Factors include: advanced age (>51men, >22 women);dyslipidemia;male gender     Objective:    Today's Vitals   07/03/21 1512  Weight: 145 lb (65.8 kg)  Height: 5\' 10"  (1.778 m)   Body mass index is 20.81 kg/m.  Advanced Directives 07/03/2021 06/21/2020 11/05/2018 05/28/2017 01/31/2012  Does Patient Have a Medical Advance Directive? No No No No Patient does not have advance directive  Would patient like information on creating a medical advance directive? Yes (MAU/Ambulatory/Procedural Areas - Information given) Yes (MAU/Ambulatory/Procedural Areas - Information given) No - Patient declined Yes (MAU/Ambulatory/Procedural Areas - Information given) -    Current Medications (verified) Outpatient Encounter Medications as of 07/03/2021  Medication Sig   busPIRone (BUSPAR) 10 MG tablet Take 10 mg by mouth 3  (three) times daily.   doxepin (SINEQUAN) 10 MG capsule    Garlic 500 MG TABS Take 1 tablet by mouth daily.   Multiple Vitamin (MULTIVITAMIN WITH MINERALS) TABS tablet Take 1 tablet by mouth daily.   risperiDONE (RISPERDAL) 3 MG tablet    triamcinolone cream (KENALOG) 0.5 % Apply 1 application topically 2 (two) times daily. (Patient not taking: Reported on 07/03/2021)   [DISCONTINUED] ARIPiprazole (ABILIFY) 20 MG tablet Take 20 mg by mouth daily. Per [facility]    No facility-administered encounter medications on file as of 07/03/2021.    Allergies (verified) Seasonal ic [cholestatin]   History: Past Medical History:  Diagnosis Date   ADVEF, DRUG/MEDICINAL/BIOLOGICAL SUBST NOS 03/31/2007   Allergy    Hayfever    SCHIZOPHRENIA, PARANOID, UNSPECIFIED 03/31/2007   Sebaceous cyst 01/24/2010   Past Surgical History:  Procedure Laterality Date   ANKLE SURGERY Right    fracture   COLONOSCOPY     Family History  Problem Relation Age of Onset   Stomach cancer Mother    Cancer Maternal Grandmother        type unknown   Colon cancer Neg Hx    Rectal cancer Neg Hx    Social History   Socioeconomic History   Marital status: Single    Spouse name: Not on file   Number of children: 0   Years of education: Not on file   Highest education level: Not on file  Occupational History   Occupation: janitor  Tobacco Use   Smoking status: Former    Packs/day: 0.50    Years: 10.00    Pack years: 5.00    Types: Cigarettes    Quit date: 01/08/1999  Years since quitting: 22.4   Smokeless tobacco: Never  Vaping Use   Vaping Use: Never used  Substance and Sexual Activity   Alcohol use: No   Drug use: Not Currently    Types: Marijuana    Comment: former user of marijuana   Sexual activity: Not on file  Other Topics Concern   Not on file  Social History Narrative   Not on file   Social Determinants of Health   Financial Resource Strain: Low Risk    Difficulty of Paying  Living Expenses: Not hard at all  Food Insecurity: No Food Insecurity   Worried About Programme researcher, broadcasting/film/video in the Last Year: Never true   Ran Out of Food in the Last Year: Never true  Transportation Needs: No Transportation Needs   Lack of Transportation (Medical): No   Lack of Transportation (Non-Medical): No  Physical Activity: Insufficiently Active   Days of Exercise per Week: 6 days   Minutes of Exercise per Session: 20 min  Stress: No Stress Concern Present   Feeling of Stress : Only a little  Social Connections: Not on file    Tobacco Counseling Counseling given: Not Answered   Clinical Intake:  Pre-visit preparation completed: Yes  Pain : No/denies pain     Nutritional Status: BMI of 19-24  Normal Nutritional Risks: None Diabetes: No  How often do you need to have someone help you when you read instructions, pamphlets, or other written materials from your doctor or pharmacy?: 1 - Never What is the last grade level you completed in school?: Associates degree  Diabetic? no  Interpreter Needed?: No  Information entered by :: NAllen LPN   Activities of Daily Living In your present state of health, do you have any difficulty performing the following activities: 07/03/2021  Hearing? Y  Comment has hearing aide but does not use it  Vision? N  Difficulty concentrating or making decisions? Y  Walking or climbing stairs? N  Dressing or bathing? N  Doing errands, shopping? N  Preparing Food and eating ? N  Using the Toilet? N  In the past six months, have you accidently leaked urine? N  Do you have problems with loss of bowel control? N  Managing your Medications? N  Managing your Finances? N  Housekeeping or managing your Housekeeping? N  Some recent data might be hidden    Patient Care Team: Kristian Covey, MD as PCP - General  Indicate any recent Medical Services you may have received from other than [facility] providers in the past year (date may be  approximate).     Assessment:   This is a routine wellness examination for [facility].  Hearing/Vision screen Vision Screening - Comments:: No regular eye exams, [facility]  Dietary issues and exercise activities discussed: Current Exercise Habits: Home exercise routine, Type of exercise: strength training/weights, Time (Minutes): 20, Frequency (Times/Week): 6, Weekly Exercise (Minutes/Week): 120   Goals Addressed             This Visit's Progress    Patient Stated       07/03/2021, want to lose stomach fat, start jogging       Depression Screen PHQ 2/9 Scores 07/03/2021 06/21/2020 12/07/2019 05/28/2017  PHQ - 2 Score 0 1 0 0    Fall Risk Fall Risk  07/03/2021 06/21/2020 05/28/2017  Falls in the past year? 0 0 No  Number falls in past yr: - 0 -  Injury with Fall? - 0 -  Risk for fall due to : Medication side effect - -  Follow up Falls evaluation completed;Education provided;Falls prevention discussed Falls prevention discussed -    FALL RISK PREVENTION PERTAINING TO THE HOME:  Any stairs in or around the home? Yes  If so, are there any without handrails? Yes  Home free of loose throw rugs in walkways, pet beds, electrical cords, etc? Yes  Adequate lighting in your home to reduce risk of falls? Yes   ASSISTIVE DEVICES UTILIZED TO PREVENT FALLS:  Life alert? No  Use of a cane, walker or w/c? No  Grab bars in the bathroom? No  Shower chair or bench in shower? No  Elevated toilet seat or a handicapped toilet? No   TIMED UP AND GO:  Was the test performed? No .      Cognitive Function:     6CIT Screen 07/03/2021 06/21/2020  What Year? 0 points 0 points  What month? 0 points 0 points  What time? 0 points -  Count back from 20 0 points 0 points  Months in reverse 0 points 0 points  Repeat phrase 4 points 2 points  Total Score 4 -    Immunizations Immunization History  Administered Date(s) Administered   Influenza,inj,Quad PF,6+ Mos 05/28/2017    PFIZER(Purple Top)SARS-COV-2 Vaccination 11/14/2019, 12/05/2019   Td 02/23/2010   Tdap 11/05/2018    TDAP status: Up to date  Flu Vaccine status: Up to date  Pneumococcal vaccine status: Up to date  Covid-19 vaccine status: Completed vaccines  Qualifies for Shingles Vaccine? Yes   Zostavax completed No   Shingrix Completed?: No.    Education has been provided regarding the importance of this vaccine. Patient has been advised to call insurance company to determine out of pocket expense if they have not yet received this vaccine. Advised may also receive vaccine at local pharmacy or Health Dept. Verbalized acceptance and understanding.  Screening Tests Health Maintenance  Topic Date Due   Zoster Vaccines- Shingrix (1 of 2) Never done   COVID-19 Vaccine (3 - Booster for Pfizer series) 01/30/2020   INFLUENZA VACCINE  03/20/2021   TETANUS/TDAP  11/04/2028   COLONOSCOPY (Pts 45-78yrs Insurance coverage will need to be confirmed)  05/30/2031   Hepatitis C Screening  Completed   HIV Screening  Completed   Pneumococcal Vaccine 81-92 Years old  Aged Out   HPV VACCINES  Aged Out    Health Maintenance  Health Maintenance Due  Topic Date Due   Zoster Vaccines- Shingrix (1 of 2) Never done   COVID-19 Vaccine (3 - Booster for Pfizer series) 01/30/2020   INFLUENZA VACCINE  03/20/2021    Colorectal cancer screening: Type of screening: Colonoscopy. Completed 05/29/2021. Repeat every 10 years  Lung Cancer Screening: (Low Dose CT Chest recommended if Age 63-80 years, 30 pack-year currently smoking OR have quit w/in 15years.) does not qualify.   Lung Cancer Screening Referral: no  Additional Screening:  Hepatitis C Screening: does qualify; Completed 05/28/2017  Vision Screening: Recommended annual ophthalmology exams for early detection of glaucoma and other disorders of the eye. Is the patient up to date with their annual eye exam?  Yes  Who is the provider or what is the name of the  office in which the patient attends annual eye exams? [facility] If pt is not established with a provider, would they like to be referred to a provider to establish care? No .   Dental Screening: Recommended annual dental exams for proper oral hygiene  Community Resource Referral / Chronic Care Management: CRR required this visit?  No   CCM required this visit?  No      Plan:     I have personally reviewed and noted the following in the patient's chart:   Medical and social history Use of alcohol, tobacco or illicit drugs  Current medications and supplements including opioid prescriptions. Patient is not currently taking opioid prescriptions. Functional ability and status Nutritional status Physical activity Advanced directives List of other physicians Hospitalizations, surgeries, and ER visits in previous 12 months Vitals Screenings to include cognitive, depression, and falls Referrals and appointments  In addition, I have reviewed and discussed with patient certain preventive protocols, quality metrics, and best practice recommendations. A written personalized care plan for preventive services as well as general preventive health recommendations were provided to patient.     Barb Merino, LPN   21/19/[id number]   Nurse Notes: none

## 2021-07-03 NOTE — Patient Instructions (Signed)
[name] , Thank you for taking time to come for your Medicare Wellness Visit. I appreciate your ongoing commitment to your health goals. Please review the following plan we discussed and let me know if I can assist you in the future.   Screening recommendations/referrals: Colonoscopy: completed 05/29/2021 Recommended yearly ophthalmology/optometry visit for glaucoma screening and checkup Recommended yearly dental visit for hygiene and checkup  Vaccinations: Influenza vaccine: completed per patient Pneumococcal vaccine: n/a Tdap vaccine: completed 11/05/2018, due 11/04/2028 Shingles vaccine: discussed   Covid-19:  12/05/2019, 11/14/2019  Advanced directives: mailing out a copy  Conditions/risks identified: none  Next appointment: Follow up in one year for your annual wellness visit   Preventive Care 40-64 Years, Male Preventive care refers to lifestyle choices and visits with your health care provider that can promote health and wellness. What does preventive care include? A yearly physical exam. This is also called an annual well check. Dental exams once or twice a year. Routine eye exams. Ask your health care provider how often you should have your eyes checked. Personal lifestyle choices, including: Daily care of your teeth and gums. Regular physical activity. Eating a healthy diet. Avoiding tobacco and drug use. Limiting alcohol use. Practicing safe sex. Taking low-dose aspirin every day starting at age 30. What happens during an annual well check? The services and screenings done by your health care provider during your annual well check will depend on your age, overall health, lifestyle risk factors, and family history of disease. Counseling  Your health care provider may ask you questions about your: Alcohol use. Tobacco use. Drug use. Emotional well-being. Home and relationship well-being. Sexual activity. Eating habits. Work and work Astronomer. Screening  You  may have the following tests or measurements: Height, weight, and BMI. Blood pressure. Lipid and cholesterol levels. These may be checked every 5 years, or more frequently if you are over 48 years old. Skin check. Lung cancer screening. You may have this screening every year starting at age 61 if you have a 30-pack-year history of smoking and currently smoke or have quit within the past 15 years. Fecal occult blood test (FOBT) of the stool. You may have this test every year starting at age 43. Flexible sigmoidoscopy or colonoscopy. You may have a sigmoidoscopy every 5 years or a colonoscopy every 10 years starting at age 56. Prostate cancer screening. Recommendations will vary depending on your family history and other risks. Hepatitis C blood test. Hepatitis B blood test. Sexually transmitted disease (STD) testing. Diabetes screening. This is done by checking your blood sugar (glucose) after you have not eaten for a while (fasting). You may have this done every 1-3 years. Discuss your test results, treatment options, and if necessary, the need for more tests with your health care provider. Vaccines  Your health care provider may recommend certain vaccines, such as: Influenza vaccine. This is recommended every year. Tetanus, diphtheria, and acellular pertussis (Tdap, Td) vaccine. You may need a Td booster every 10 years. Zoster vaccine. You may need this after age 64. Pneumococcal 13-valent conjugate (PCV13) vaccine. You may need this if you have certain conditions and have not been vaccinated. Pneumococcal polysaccharide (PPSV23) vaccine. You may need one or two doses if you smoke cigarettes or if you have certain conditions. Talk to your health care provider about which screenings and vaccines you need and how often you need them. This information is not intended to replace advice given to you by your health care provider. Make sure you  discuss any questions you have with your health care  provider. Document Released: 09/02/2015 Document Revised: 04/25/2016 Document Reviewed: 06/07/2015 Elsevier Interactive Patient Education  2017 [employer] Prevention in the Home Falls can cause injuries. They can happen to people of all ages. There are many things you can do to make your home safe and to help prevent falls. What can I do on the outside of my home? Regularly fix the edges of walkways and driveways and fix any cracks. Remove anything that might make you trip as you walk through a door, such as a raised step or threshold. Trim any bushes or trees on the path to your home. Use bright outdoor lighting. Clear any walking paths of anything that might make someone trip, such as rocks or tools. Regularly check to see if handrails are loose or broken. Make sure that both sides of any steps have handrails. Any raised decks and porches should have guardrails on the edges. Have any leaves, snow, or ice cleared regularly. Use sand or salt on walking paths during winter. Clean up any spills in your garage right away. This includes oil or grease spills. What can I do in the bathroom? Use night lights. Install grab bars by the toilet and in the tub and shower. Do not use towel bars as grab bars. Use non-skid mats or decals in the tub or shower. If you need to sit down in the shower, use a plastic, non-slip stool. Keep the floor dry. Clean up any water that spills on the floor as soon as it happens. Remove soap buildup in the tub or shower regularly. Attach bath mats securely with double-sided non-slip rug tape. Do not have throw rugs and other things on the floor that can make you trip. What can I do in the bedroom? Use night lights. Make sure that you have a light by your bed that is easy to reach. Do not use any sheets or blankets that are too big for your bed. They should not hang down onto the floor. Have a firm chair that has side arms. You can use this for support while  you get dressed. Do not have throw rugs and other things on the floor that can make you trip. What can I do in the kitchen? Clean up any spills right away. Avoid walking on wet floors. Keep items that you use a lot in easy-to-reach places. If you need to reach something above you, use a strong step stool that has a grab bar. Keep electrical cords out of the way. Do not use floor polish or wax that makes floors slippery. If you must use wax, use non-skid floor wax. Do not have throw rugs and other things on the floor that can make you trip. What can I do with my stairs? Do not leave any items on the stairs. Make sure that there are handrails on both sides of the stairs and use them. Fix handrails that are broken or loose. Make sure that handrails are as long as the stairways. Check any carpeting to make sure that it is firmly attached to the stairs. Fix any carpet that is loose or worn. Avoid having throw rugs at the top or bottom of the stairs. If you do have throw rugs, attach them to the floor with carpet tape. Make sure that you have a light switch at the top of the stairs and the bottom of the stairs. If you do not have them, ask someone to  add them for you. What else can I do to help prevent falls? Wear shoes that: Do not have high heels. Have rubber bottoms. Are comfortable and fit you well. Are closed at the toe. Do not wear sandals. If you use a stepladder: Make sure that it is fully opened. Do not climb a closed stepladder. Make sure that both sides of the stepladder are locked into place. Ask someone to hold it for you, if possible. Clearly mark and make sure that you can see: Any grab bars or handrails. First and last steps. Where the edge of each step is. Use tools that help you move around (mobility aids) if they are needed. These include: Canes. Walkers. Scooters. Crutches. Turn on the lights when you go into a dark area. Replace any light bulbs as soon as they burn  out. Set up your furniture so you have a clear path. Avoid moving your furniture around. If any of your floors are uneven, fix them. If there are any pets around you, be aware of where they are. Review your medicines with your doctor. Some medicines can make you feel dizzy. This can increase your chance of falling. Ask your doctor what other things that you can do to help prevent falls. This information is not intended to replace advice given to you by your health care provider. Make sure you discuss any questions you have with your health care provider. Document Released: 06/02/2009 Document Revised: 01/12/2016 Document Reviewed: 09/10/2014 Elsevier Interactive Patient Education  2017 [employer].

## 2021-09-11 DIAGNOSIS — F411 Generalized anxiety disorder: Secondary | ICD-10-CM | POA: Diagnosis not present

## 2021-09-11 DIAGNOSIS — F5101 Primary insomnia: Secondary | ICD-10-CM | POA: Diagnosis not present

## 2021-09-11 DIAGNOSIS — F209 Schizophrenia, unspecified: Secondary | ICD-10-CM | POA: Diagnosis not present

## 2021-12-04 DIAGNOSIS — F5101 Primary insomnia: Secondary | ICD-10-CM | POA: Diagnosis not present

## 2021-12-04 DIAGNOSIS — F411 Generalized anxiety disorder: Secondary | ICD-10-CM | POA: Diagnosis not present

## 2021-12-04 DIAGNOSIS — F209 Schizophrenia, unspecified: Secondary | ICD-10-CM | POA: Diagnosis not present

## 2022-04-13 DIAGNOSIS — F5101 Primary insomnia: Secondary | ICD-10-CM | POA: Diagnosis not present

## 2022-04-13 DIAGNOSIS — F209 Schizophrenia, unspecified: Secondary | ICD-10-CM | POA: Diagnosis not present

## 2022-04-13 DIAGNOSIS — F411 Generalized anxiety disorder: Secondary | ICD-10-CM | POA: Diagnosis not present

## 2022-06-28 DIAGNOSIS — F209 Schizophrenia, unspecified: Secondary | ICD-10-CM | POA: Diagnosis not present

## 2022-06-28 DIAGNOSIS — F411 Generalized anxiety disorder: Secondary | ICD-10-CM | POA: Diagnosis not present

## 2022-06-28 DIAGNOSIS — F5101 Primary insomnia: Secondary | ICD-10-CM | POA: Diagnosis not present

## 2022-07-23 ENCOUNTER — Ambulatory Visit: Payer: PPO

## 2022-09-24 DIAGNOSIS — F5101 Primary insomnia: Secondary | ICD-10-CM | POA: Diagnosis not present

## 2022-09-24 DIAGNOSIS — F209 Schizophrenia, unspecified: Secondary | ICD-10-CM | POA: Diagnosis not present

## 2022-09-24 DIAGNOSIS — F411 Generalized anxiety disorder: Secondary | ICD-10-CM | POA: Diagnosis not present

## 2022-11-10 ENCOUNTER — Emergency Department (HOSPITAL_COMMUNITY)
Admission: EM | Admit: 2022-11-10 | Discharge: 2022-11-10 | Disposition: A | Discharge status: Home / Self Care | Payer: PPO | Attending: Emergency Medicine | Admitting: Emergency Medicine

## 2022-11-10 ENCOUNTER — Other Ambulatory Visit: Payer: Self-pay

## 2022-11-10 ENCOUNTER — Emergency Department (HOSPITAL_COMMUNITY): Payer: PPO

## 2022-11-10 DIAGNOSIS — S0083XA Contusion of other part of head, initial encounter: Secondary | ICD-10-CM | POA: Insufficient documentation

## 2022-11-10 DIAGNOSIS — S2242XA Multiple fractures of ribs, left side, initial encounter for closed fracture: Secondary | ICD-10-CM | POA: Diagnosis not present

## 2022-11-10 DIAGNOSIS — S299XXA Unspecified injury of thorax, initial encounter: Secondary | ICD-10-CM | POA: Diagnosis present

## 2022-11-10 DIAGNOSIS — N400 Enlarged prostate without lower urinary tract symptoms: Secondary | ICD-10-CM | POA: Diagnosis not present

## 2022-11-10 DIAGNOSIS — Z0471 Encounter for examination and observation following alleged adult physical abuse: Secondary | ICD-10-CM | POA: Diagnosis not present

## 2022-11-10 DIAGNOSIS — S0993XA Unspecified injury of face, initial encounter: Secondary | ICD-10-CM | POA: Diagnosis not present

## 2022-11-10 DIAGNOSIS — S199XXA Unspecified injury of neck, initial encounter: Secondary | ICD-10-CM | POA: Diagnosis not present

## 2022-11-10 DIAGNOSIS — S2232XA Fracture of one rib, left side, initial encounter for closed fracture: Secondary | ICD-10-CM | POA: Diagnosis not present

## 2022-11-10 DIAGNOSIS — S20212A Contusion of left front wall of thorax, initial encounter: Secondary | ICD-10-CM | POA: Diagnosis not present

## 2022-11-10 DIAGNOSIS — S0990XA Unspecified injury of head, initial encounter: Secondary | ICD-10-CM | POA: Diagnosis not present

## 2022-11-10 DIAGNOSIS — Y92524 Gas station as the place of occurrence of the external cause: Secondary | ICD-10-CM | POA: Diagnosis not present

## 2022-11-10 LAB — I-STAT CHEM 8, ED
BUN: 4 mg/dL — ABNORMAL LOW (ref 6–20)
Calcium, Ion: 1.2 mmol/L (ref 1.15–1.40)
Chloride: 102 mmol/L (ref 98–111)
Creatinine, Ser: 0.7 mg/dL (ref 0.61–1.24)
Glucose, Bld: 106 mg/dL — ABNORMAL HIGH (ref 70–99)
HCT: 43 % (ref 39.0–52.0)
Hemoglobin: 14.6 g/dL (ref 13.0–17.0)
Potassium: 3.6 mmol/L (ref 3.5–5.1)
Sodium: 140 mmol/L (ref 135–145)
TCO2: 26 mmol/L (ref 22–32)

## 2022-11-10 LAB — URINALYSIS, ROUTINE W REFLEX MICROSCOPIC
Bilirubin Urine: NEGATIVE
Glucose, UA: NEGATIVE mg/dL
Hgb urine dipstick: NEGATIVE
Ketones, ur: NEGATIVE mg/dL
Leukocytes,Ua: NEGATIVE
Nitrite: NEGATIVE
Protein, ur: NEGATIVE mg/dL
Specific Gravity, Urine: 1.003 — ABNORMAL LOW (ref 1.005–1.030)
pH: 8 (ref 5.0–8.0)

## 2022-11-10 MED ORDER — IOHEXOL 300 MG/ML  SOLN
100.0000 mL | Freq: Once | INTRAMUSCULAR | Status: AC | PRN
  Administered 2022-11-10: 100 mL via INTRAVENOUS

## 2022-11-10 MED ORDER — ACETAMINOPHEN 325 MG PO TABS
650.0000 mg | ORAL_TABLET | Freq: Once | ORAL | Status: AC
  Administered 2022-11-10: 650 mg via ORAL
  Filled 2022-11-10: qty 2

## 2022-11-10 NOTE — ED Notes (Signed)
Patient refused tylenol

## 2022-11-10 NOTE — Discharge Instructions (Addendum)
Your jaw is not broken.  You do have 3 rib fractures on the left side.  There is a trace area of pneumothorax which is air outside of your lung.  This should absorb slowly on its own and not need any further intervention.  Follow-up with your doctor.  Return to the ED with difficulty breathing, chest pain, worsening headache or other concerns
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
## 2022-11-15 ENCOUNTER — Telehealth: Payer: Self-pay | Admitting: *Deleted

## 2022-11-15 NOTE — Telephone Encounter (Signed)
     Patient  visit on 11/10/2022  at [facility] long  was for treatment   Have you been able to follow up with your primary care physician?Doing fine and has all medications and access to transportation   The patient was able to obtain any needed medicine or equipment.  Are there diet recommendations that you are having difficulty following?  Patient expresses understanding of discharge instructions and education provided has no other needs at this time.    [place] [phone] [address] , [place] [address] Email : [email or web address]

## 2022-12-11 DIAGNOSIS — F5101 Primary insomnia: Secondary | ICD-10-CM | POA: Diagnosis not present

## 2022-12-11 DIAGNOSIS — F209 Schizophrenia, unspecified: Secondary | ICD-10-CM | POA: Diagnosis not present

## 2022-12-11 DIAGNOSIS — F411 Generalized anxiety disorder: Secondary | ICD-10-CM | POA: Diagnosis not present

## 2023-01-15 ENCOUNTER — Ambulatory Visit: Payer: PPO | Admitting: Adult Health

## 2023-03-14 ENCOUNTER — Telehealth: Payer: Self-pay

## 2023-03-14 NOTE — Telephone Encounter (Signed)
LVM for patient to call back [phone], or to call PCP office to schedule follow up apt. AS, CMA  

## 2023-04-04 ENCOUNTER — Telehealth: Payer: Self-pay

## 2023-04-04 NOTE — Telephone Encounter (Signed)
LVM for patient to call back [phone], or to call PCP office to schedule follow up apt. AS, CMA  

## 2023-05-01 DIAGNOSIS — F209 Schizophrenia, unspecified: Secondary | ICD-10-CM | POA: Diagnosis not present

## 2023-05-01 DIAGNOSIS — F411 Generalized anxiety disorder: Secondary | ICD-10-CM | POA: Diagnosis not present

## 2023-05-01 DIAGNOSIS — F5101 Primary insomnia: Secondary | ICD-10-CM | POA: Diagnosis not present

## 2023-08-07 DIAGNOSIS — F411 Generalized anxiety disorder: Secondary | ICD-10-CM | POA: Diagnosis not present

## 2023-08-07 DIAGNOSIS — F5101 Primary insomnia: Secondary | ICD-10-CM | POA: Diagnosis not present

## 2023-08-07 DIAGNOSIS — F209 Schizophrenia, unspecified: Secondary | ICD-10-CM | POA: Diagnosis not present

## 2023-10-04 DIAGNOSIS — F411 Generalized anxiety disorder: Secondary | ICD-10-CM | POA: Diagnosis not present

## 2023-10-04 DIAGNOSIS — F209 Schizophrenia, unspecified: Secondary | ICD-10-CM | POA: Diagnosis not present

## 2024-01-02 ENCOUNTER — Other Ambulatory Visit: Payer: Self-pay | Admitting: Nurse Practitioner

## 2024-01-02 DIAGNOSIS — R972 Elevated prostate specific antigen [PSA]: Secondary | ICD-10-CM

## 2024-02-14 ENCOUNTER — Ambulatory Visit
Admission: RE | Admit: 2024-02-14 | Discharge: 2024-02-14 | Disposition: A | Discharge status: Home / Self Care | Source: Ambulatory Visit | Attending: Nurse Practitioner

## 2024-02-14 DIAGNOSIS — R972 Elevated prostate specific antigen [PSA]: Secondary | ICD-10-CM

## 2024-02-14 MED ORDER — GADOPICLENOL 0.5 MMOL/ML IV SOLN
6.5000 mL | Freq: Once | INTRAVENOUS | Status: AC | PRN
  Administered 2024-02-14: 6.5 mL via INTRAVENOUS
